# Patient Record
Sex: Female | Born: 1935 | Race: White | Hispanic: No | Marital: Married | Smoking: Never smoker
Health system: Southern US, Community
[De-identification: ages and names within clinical notes are randomized; demographics above are authoritative.]

## PROBLEM LIST (undated history)

## (undated) DIAGNOSIS — R600 Localized edema: Secondary | ICD-10-CM

## (undated) DIAGNOSIS — I4891 Unspecified atrial fibrillation: Secondary | ICD-10-CM

## (undated) DIAGNOSIS — C801 Malignant (primary) neoplasm, unspecified: Secondary | ICD-10-CM

## (undated) DIAGNOSIS — E039 Hypothyroidism, unspecified: Secondary | ICD-10-CM

## (undated) DIAGNOSIS — R609 Edema, unspecified: Secondary | ICD-10-CM

## (undated) DIAGNOSIS — I10 Essential (primary) hypertension: Secondary | ICD-10-CM

## (undated) DIAGNOSIS — F0390 Unspecified dementia without behavioral disturbance: Secondary | ICD-10-CM

## (undated) DIAGNOSIS — M797 Fibromyalgia: Secondary | ICD-10-CM

## (undated) DIAGNOSIS — F419 Anxiety disorder, unspecified: Secondary | ICD-10-CM

## (undated) HISTORY — PX: CARPAL TUNNEL RELEASE: SHX101

## (undated) HISTORY — PX: JOINT REPLACEMENT: SHX530

## (undated) HISTORY — PX: APPENDECTOMY: SHX54

## (undated) HISTORY — PX: BLADDER SUSPENSION: SHX72

## (undated) HISTORY — PX: CHOLECYSTECTOMY: SHX55

## (undated) HISTORY — PX: KNEE ARTHROSCOPY: SUR90

## (undated) HISTORY — PX: ABDOMINAL HYSTERECTOMY: SHX81

---

## 1997-05-05 ENCOUNTER — Encounter: Admission: RE | Admit: 1997-05-05 | Discharge: 1997-08-03 | Payer: Self-pay | Admitting: *Deleted

## 1998-11-28 ENCOUNTER — Ambulatory Visit (HOSPITAL_COMMUNITY): Admission: RE | Admit: 1998-11-28 | Discharge: 1998-11-28 | Payer: Self-pay

## 2000-09-23 ENCOUNTER — Encounter: Admission: RE | Admit: 2000-09-23 | Discharge: 2000-09-23 | Payer: Self-pay

## 2002-01-20 ENCOUNTER — Other Ambulatory Visit: Admission: RE | Admit: 2002-01-20 | Discharge: 2002-01-20 | Payer: Self-pay | Admitting: Obstetrics and Gynecology

## 2002-03-20 ENCOUNTER — Emergency Department (HOSPITAL_COMMUNITY): Admission: EM | Admit: 2002-03-20 | Discharge: 2002-03-21 | Payer: Self-pay | Admitting: Emergency Medicine

## 2002-03-29 ENCOUNTER — Encounter: Payer: Self-pay | Admitting: Urology

## 2002-04-01 ENCOUNTER — Encounter (INDEPENDENT_AMBULATORY_CARE_PROVIDER_SITE_OTHER): Payer: Self-pay | Admitting: Specialist

## 2002-04-01 ENCOUNTER — Inpatient Hospital Stay (HOSPITAL_COMMUNITY): Admission: RE | Admit: 2002-04-01 | Discharge: 2002-04-02 | Payer: Self-pay | Admitting: Urology

## 2004-02-07 ENCOUNTER — Encounter: Admission: RE | Admit: 2004-02-07 | Discharge: 2004-05-07 | Payer: Self-pay

## 2004-05-31 ENCOUNTER — Encounter: Admission: RE | Admit: 2004-05-31 | Discharge: 2004-05-31 | Payer: Self-pay

## 2005-02-11 ENCOUNTER — Other Ambulatory Visit: Admission: RE | Admit: 2005-02-11 | Discharge: 2005-02-11 | Payer: Self-pay | Admitting: Obstetrics and Gynecology

## 2005-10-28 ENCOUNTER — Encounter: Admission: RE | Admit: 2005-10-28 | Discharge: 2005-10-28 | Payer: Self-pay | Admitting: Cardiology

## 2006-07-25 ENCOUNTER — Ambulatory Visit (HOSPITAL_COMMUNITY): Admission: RE | Admit: 2006-07-25 | Discharge: 2006-07-25 | Payer: Self-pay | Admitting: Cardiology

## 2006-07-28 ENCOUNTER — Emergency Department (HOSPITAL_COMMUNITY): Admission: EM | Admit: 2006-07-28 | Discharge: 2006-07-28 | Payer: Self-pay | Admitting: Emergency Medicine

## 2006-09-29 ENCOUNTER — Encounter: Admission: RE | Admit: 2006-09-29 | Discharge: 2006-09-29 | Payer: Self-pay | Admitting: Cardiology

## 2006-12-10 ENCOUNTER — Inpatient Hospital Stay (HOSPITAL_COMMUNITY): Admission: AD | Admit: 2006-12-10 | Discharge: 2006-12-11 | Payer: Self-pay | Admitting: Orthopedic Surgery

## 2007-04-08 ENCOUNTER — Inpatient Hospital Stay (HOSPITAL_COMMUNITY): Admission: RE | Admit: 2007-04-08 | Discharge: 2007-04-15 | Payer: Self-pay | Admitting: Orthopaedic Surgery

## 2007-09-01 ENCOUNTER — Encounter: Admission: RE | Admit: 2007-09-01 | Discharge: 2007-09-01 | Payer: Self-pay | Admitting: Cardiology

## 2008-03-01 ENCOUNTER — Encounter: Admission: RE | Admit: 2008-03-01 | Discharge: 2008-03-01 | Payer: Self-pay | Admitting: Cardiology

## 2008-08-23 ENCOUNTER — Encounter: Admission: RE | Admit: 2008-08-23 | Discharge: 2008-08-23 | Payer: Self-pay | Admitting: Cardiology

## 2009-03-11 IMAGING — CR DG CHEST 2V
2 series · 2 of 2 positions shown · non-contrast
Comparison: 10/28/05.

CLINICAL DATA: Pre-cardiac catheterization.
 CHEST - 2 VIEW - 07/25/06:

[view not recorded (1 of 2)]
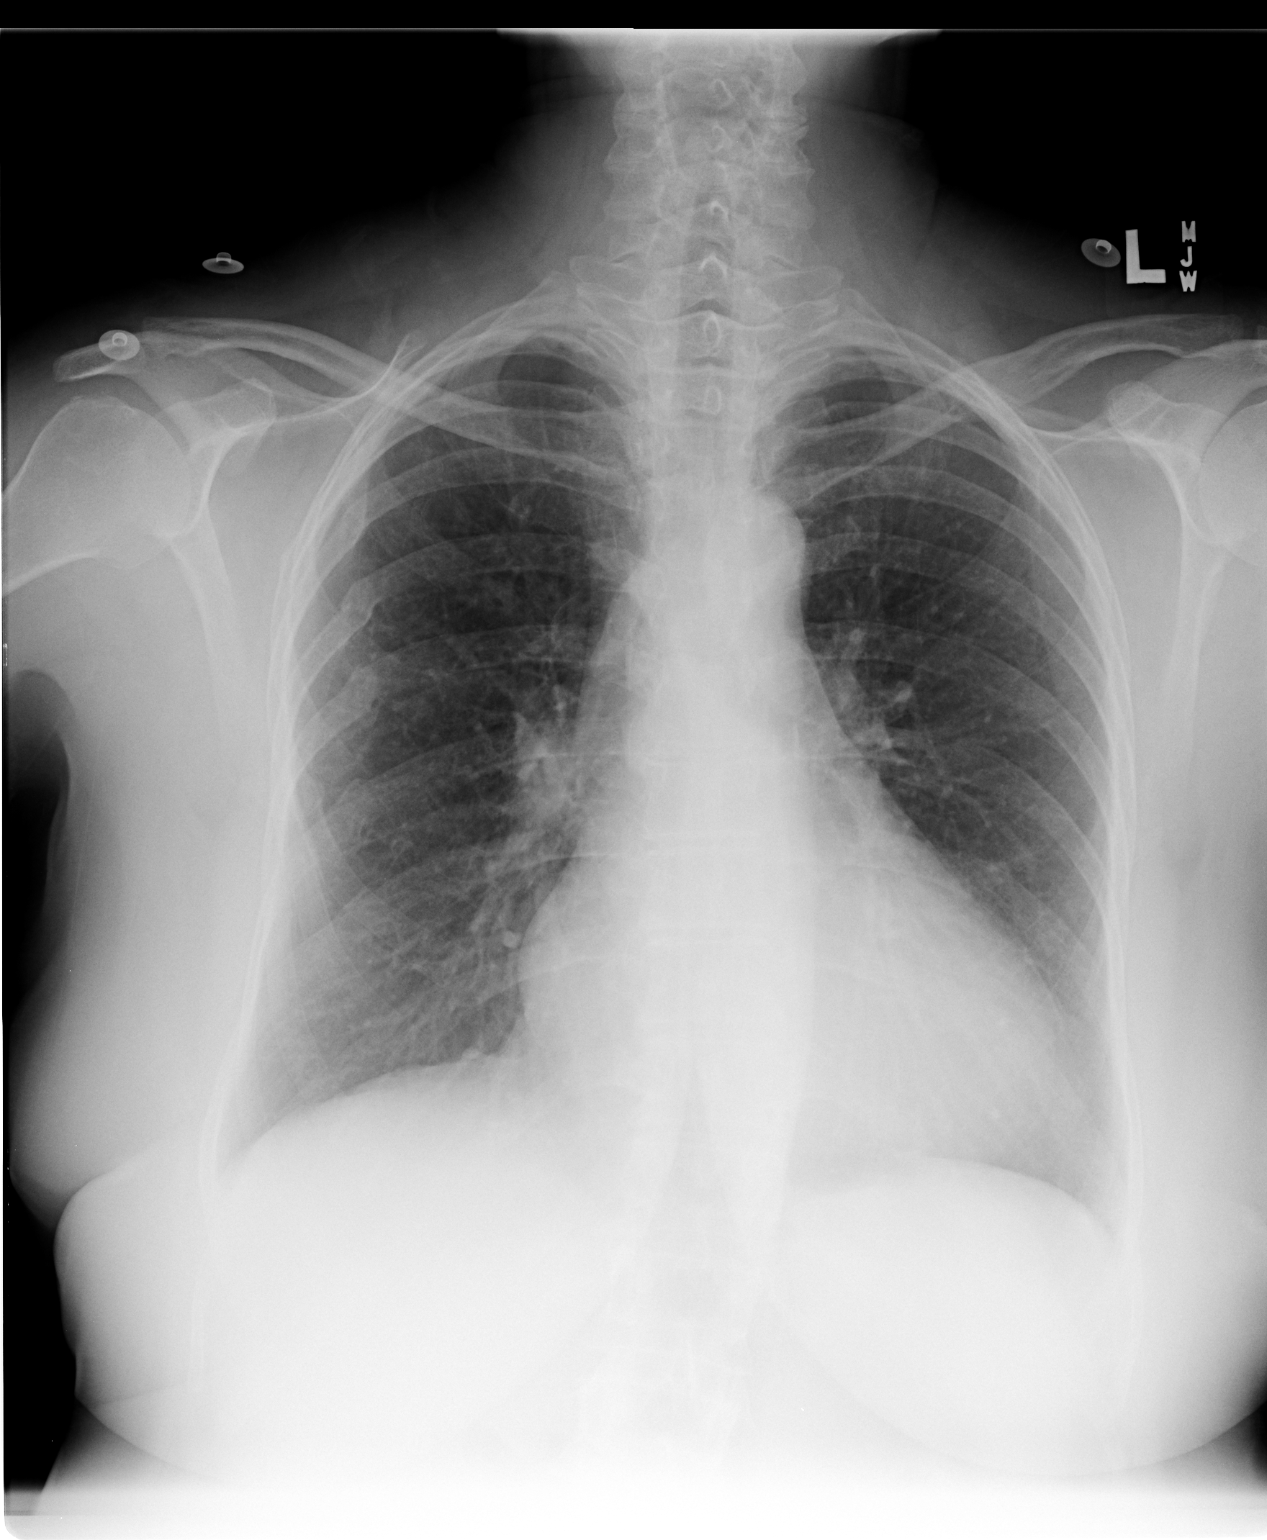

[view not recorded (2 of 2)]
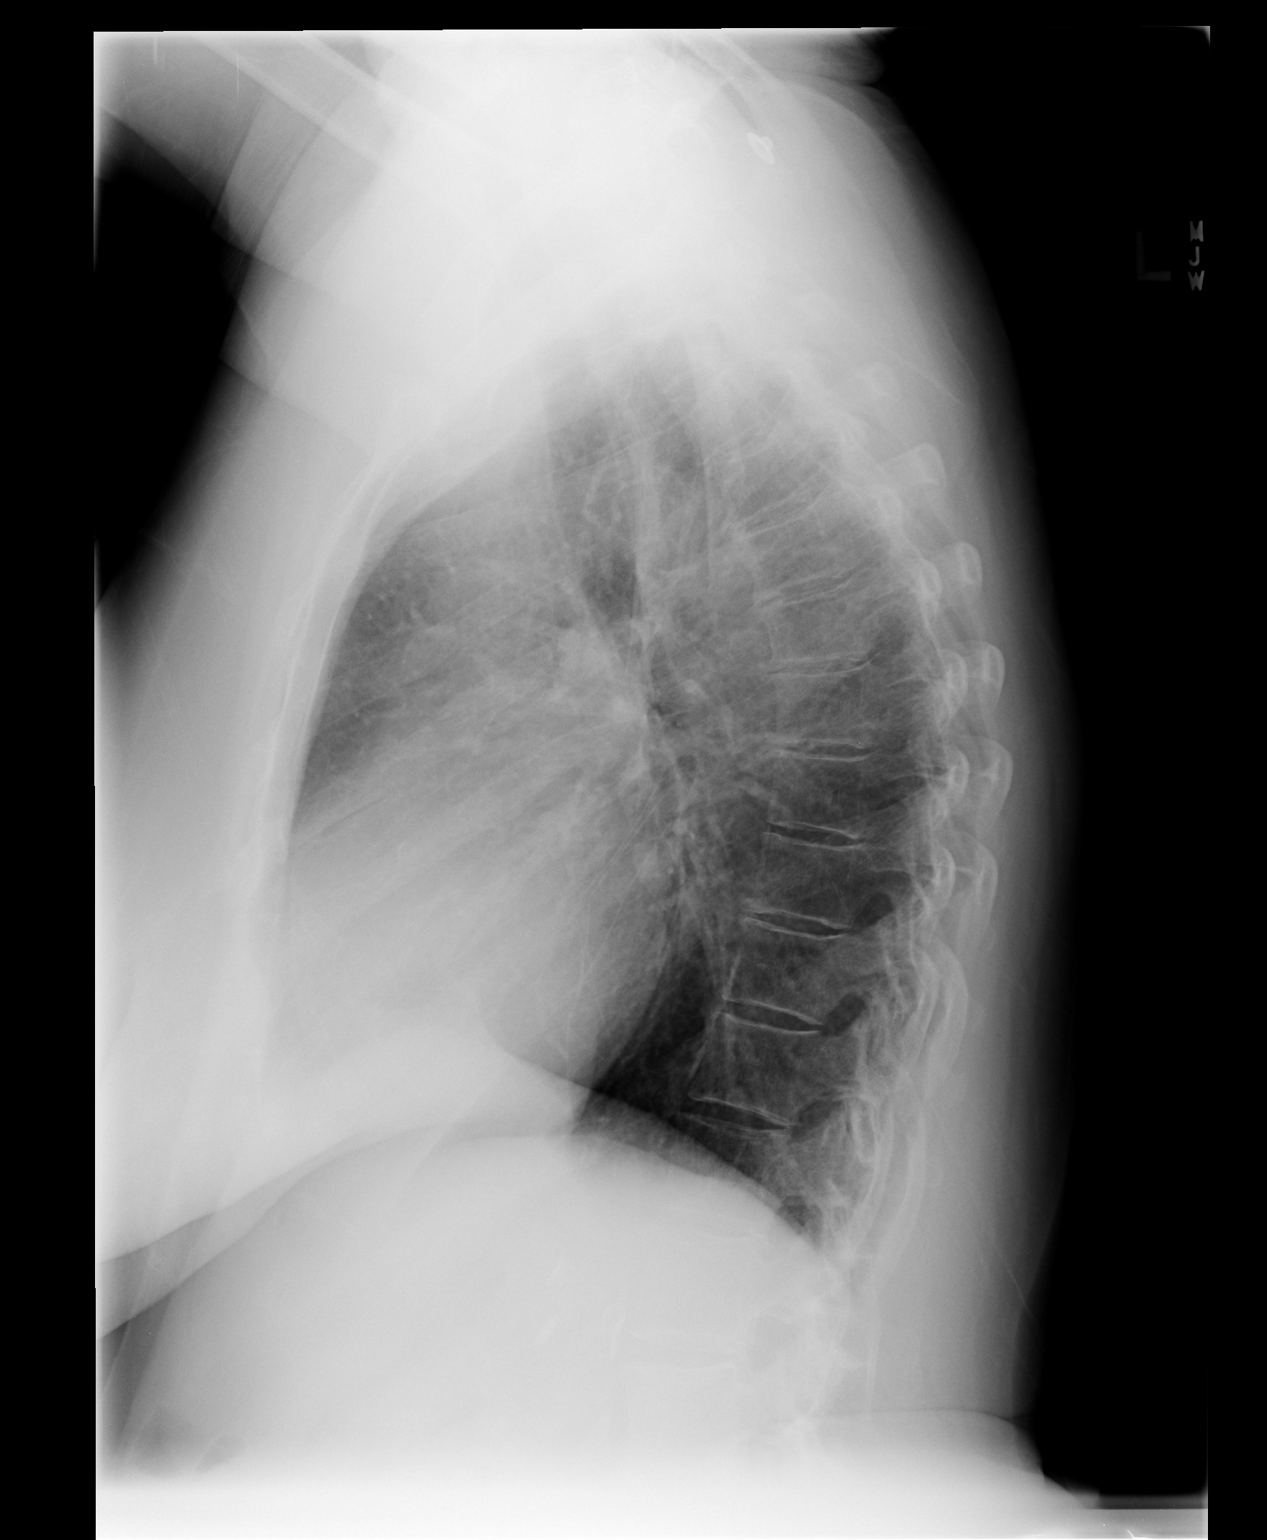

[2 of 2 positions shown; findings below may reference images not displayed]

FINDINGS: The trachea is midline.   Cardiac enlargement is stable.  The lungs are clear.   Old rib fractures.
IMPRESSION: Cardiac enlargement without acute finding.

## 2009-03-14 IMAGING — CR DG CHEST 1V PORT
1 series · 1 of 1 positions shown · non-contrast
Comparison: 07/25/06.

CLINICAL DATA: Chest pain and shortness of breath.
 PORTABLE CHEST - 1 VIEW - 07/28/06:

[view not recorded]
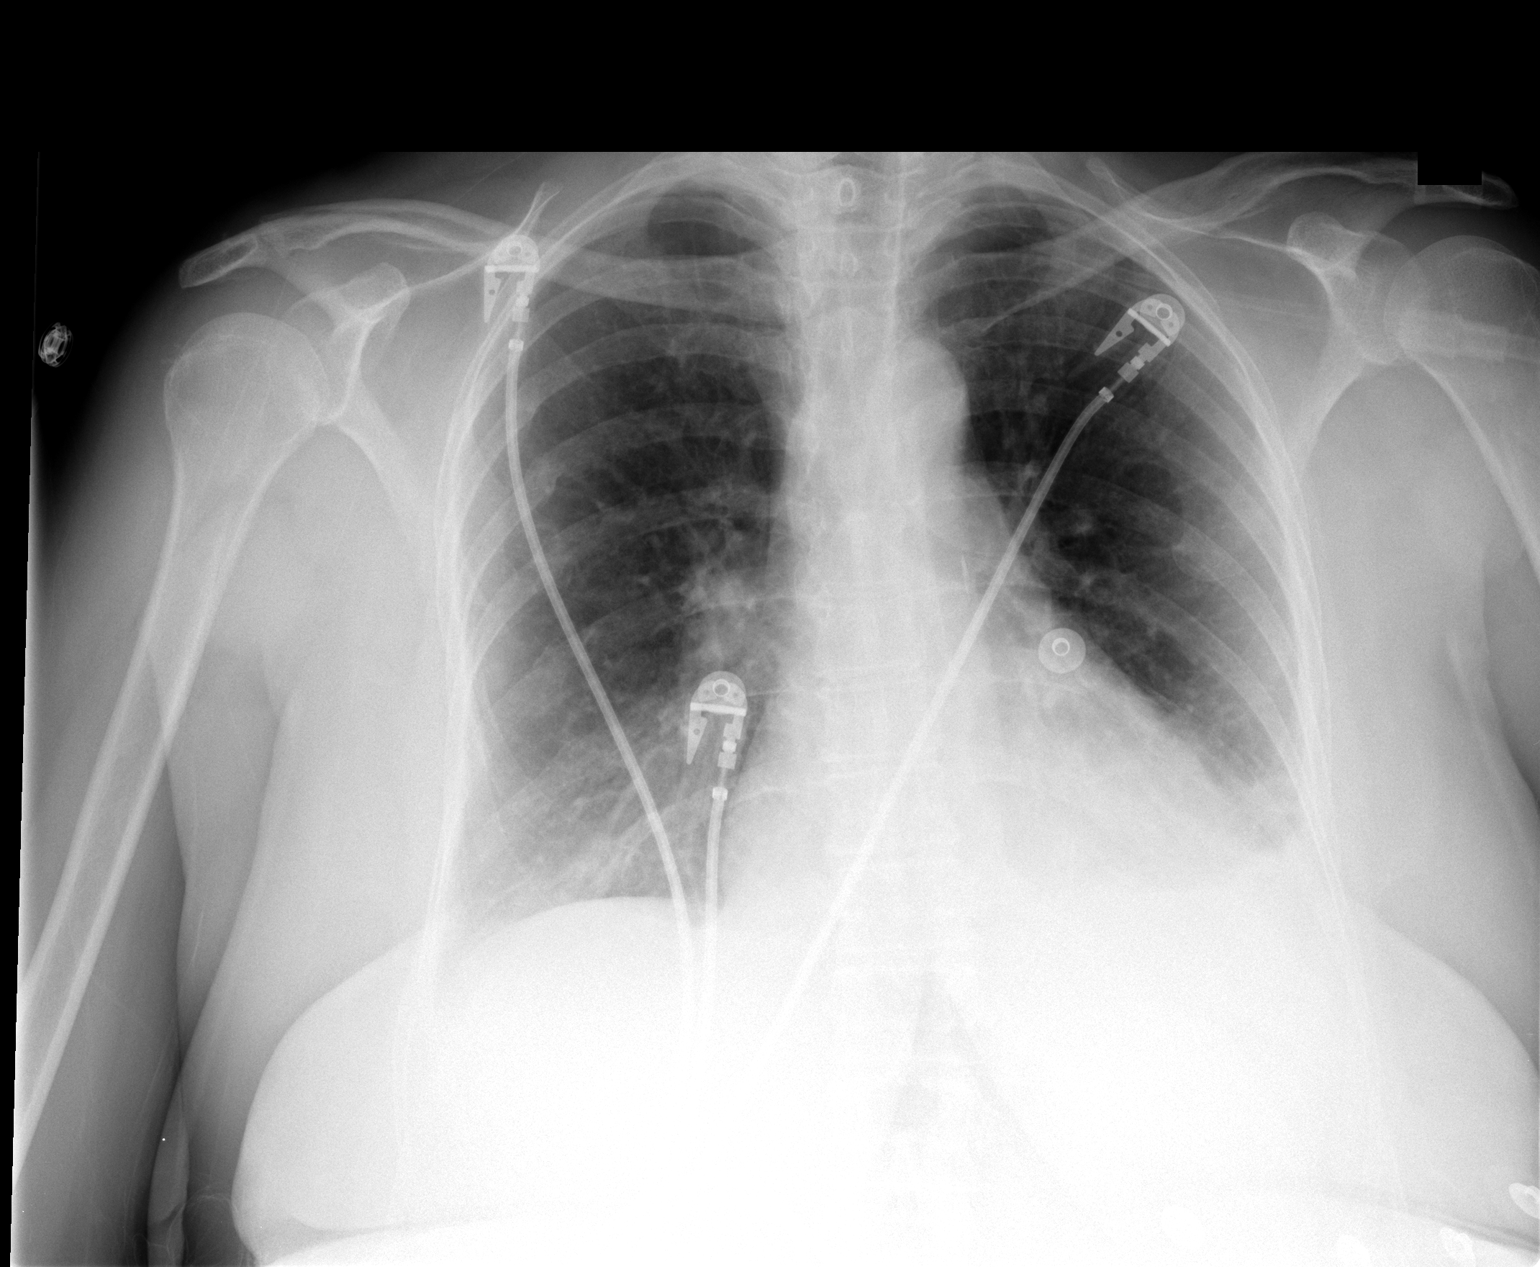

[1 of 1 positions shown; findings below may reference images not displayed]

FINDINGS: Left effusion with left lower lobe atelectasis.  Question underlying infiltrate.  Negative for edema.  The heart is normal.
IMPRESSION: Left effusion with left lower lobe atelectasis.

## 2010-02-26 ENCOUNTER — Encounter
Admission: RE | Admit: 2010-02-26 | Discharge: 2010-02-26 | Payer: Self-pay | Source: Home / Self Care | Attending: Cardiology | Admitting: Cardiology

## 2010-06-19 NOTE — Op Note (Signed)
NAMEMARCELLENE, SHIVLEY NO.:  [id number]   MEDICAL RECORD NO.:  [id number]          PATIENT TYPE:  OIB   LOCATION:  5035                         FACILITY:  [facility]   PHYSICIAN:  Burnard Bunting, M.D.    DATE OF BIRTH:  1926-07-10   DATE OF PROCEDURE:  12/09/2006  DATE OF DISCHARGE:                               OPERATIVE REPORT   PREOPERATIVE DIAGNOSIS:  Left knee medial meniscal tear.   POSTOPERATIVE DIAGNOSIS:  Left knee medial meniscal tear.   PROCEDURE:  Left knee diagnostic and operative arthroscopy with partial  posterior horn medial meniscectomy and microfracture of medial femoral  condyle defect.   SURGEON:  Burnard Bunting, M.D.   ASSISTANT:  None.   ANESTHESIA:  MAC plus local.   INDICATIONS:  Chandani Rogowski is a 77 year old female who felt a pop in her  knee and has had painful swelling since that time.  She presents now for  operative management after failure of conservative management.   OPERATIVE FINDINGS:  1. Examination under anesthesia:  Range of motion 0-130 with stability      to varus-valgus stress, ACL and PCL intact, no posterior rotatory      instability is noted.  2. 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   PROCEDURE IN DETAIL:  The patient was brought to the operating room,  where MAC plus local anesthetic was induced.  The left leg and foot were  prepped with DuraPrep solution and draped in a sterile manner.   Topographic anatomy of the knee was then found including the medial and  lateral margins of the  patellar tendon, the medial and lateral joint  line.  Anterior and inferolateral portals were established.  Anteroinferior and medial portals were then established under direct  visualization.  Diagnostic arthroscopy was performed.  Chondromalacia  was noted on the undersurface of the patella, but there were no chondral  flaps.  The suprapatellar pouch had synovitis, which was not debrided.  No loose bodies in the medial or lateral gutter.  The medial compartment  intact.  The lateral compartment was intact.  ACL and PCL was intact.  The medial compartment demonstrated a small chondral defect measuring  just under 1 x 1 cm, which was treated with debridement and  microfracture.  There was also a complete radial tear of the posterior  horn of the medial meniscus, the unstable flap which was also debrided  back to  a stable rim.  Following debridement, the knee joint was thoroughly  irrigated and instruments removed.  Portals were closed using 3-0 nylon  sutures.  A solution of Marcaine, morphine and clonidine were injected  into the knee.  The patient tolerated the procedure well without  immediate complications.  A bulky wrap was applied.      Burnard Bunting, M.D.  Electronically Signed     GSD/MEDQ  D:  12/09/2006  T:  12/10/2006  Job:  [id number]

## 2010-06-19 NOTE — Op Note (Signed)
NAMECOHEN, DOLEMAN NO.:  [id number]   MEDICAL RECORD NO.:  [id number]          PATIENT TYPE:  INP   LOCATION:  5034                         FACILITY:  [facility]   PHYSICIAN:  Mark C. Ophelia Charter, M.D.    DATE OF BIRTH:  [birth year]   DATE OF PROCEDURE:  04/08/2007  DATE OF DISCHARGE:                               OPERATIVE REPORT   PREOPERATIVE DIAGNOSIS:  Left knee osteoarthritis.   POSTOPERATIVE DIAGNOSIS:  Left knee osteoarthritis.   PROCEDURE:  Left cemented total knee arthroplasty computer assist.   SURGEON:  Mark C. Ophelia Charter, M.D.   ANESTHESIA:  GOT plus Marcaine local plus preoperative femoral nerve  block.   TOURNIQUET TIME:  1 hour and 12 minutes   ASSISTANT:  Maud Deed, P.A.-C.   COMPLICATIONS:  None.   ESTIMATED BLOOD LOSS:  Less than 100 mL.   COMPONENTS USED:  DePuy J&J PFC number 2 femur with lugs, rotating  platform 12.5 mm thickness poly, number 2 tibial tray and 35 mm all poly  patella.   PROCEDURE IN DETAIL:  After general anesthesia with orotracheal  intubation, preoperative Ancef prophylaxis, standard prep and drape with  DuraPrep with a heel bump, lateral posts, that would contact the  tourniquet with the knee at 90 degrees flexion.  Impervious stockinette  and Coban.  Usual total knee split sheets, drapes, sterile skin marker,  Betadine, and Vidrape was used to seal the skin.  The time out procedure  was then taken with confirmation of antibiotics, positioning, patient  identification, planned procedure, permit.  The leg was wrapped in an  Esmarch and tourniquet inflated.  A midline incision was made. Medial  parapatellar tendon incision was made splitting the quad tendon between  the medial 1/3 and lateral 2/3.  The patella was everted and menisci  were resected.  There was bone-on-bone changes in the medial  compartment, grade 3 and 4 for chondromalacia of the patellofemoral  joint.  Sizing was appropriate for a number 2 femur and  initially 10 mm  of bone was removed off of the distal femur.  A notch cut was not made,  but chamfer cuts were made first then followed by cuts on the tibia.  Two pins were then placed in the femur for computer guidance and also  two in the tibia through a stab incision with a 15 blade and initiation  with generation computer model for femur and tibia.  There was 8 degrees  of varus and 8 degrees of flexion contracture.  The computer showed  satisfactory flexion/extension gaps within 0.5 mm on flexion/extension.  There was 0.3 degrees valgus of the femoral component.  The tibial slope  was right on on varus, valgus and AP slope.  A keel cut was made in the  tibia.  The ACL and PCL remnants were resected.  The menisci were  resected.  Pulse lavage was used.  The patella was resected with an  oscillating saw from facet to facet and then sized for 35 mm holes  drilled.  Lug holes were drilled in the femur.  Trials were inserted.  There was symmetrical flexion/extension.  The computer showed excellent  position.  All trials were removed.  Pulsatile lavage.  Cement was  mixed.  The tibia was cemented first, all excess cement was removed,  then the femur was cemented followed by the patella, and after the  cement was hard at 15 minutes, the tourniquet was deflated.  Hemostasis  was obtained and then layered closure with #1 Tycron in the deep fascia,  2-0 Vicryl in the subcutaneous tissue, and subcuticular skin closure.  Marcaine infiltration, postop dressing, knee immobilizer.  Instrument  count and needle count was correct.  The patient tolerated the procedure  well and was transferred to recovery in stable condition.      Mark C. Ophelia Charter, M.D.  Electronically Signed     MCY/MEDQ  D:  04/08/2007  T:  04/08/2007  Job:  [id number]

## 2010-06-19 NOTE — Discharge Summary (Signed)
NAMEMAGAN, WINNETT NO.:  [id number]   MEDICAL RECORD NO.:  [id number]          PATIENT TYPE:  INP   LOCATION:  5034                         FACILITY:  [facility]   PHYSICIAN:  Mark C. Ophelia Charter, M.D.    DATE OF BIRTH:  02-29-1926   DATE OF ADMISSION:  04/08/2007  DATE OF DISCHARGE:  04/15/2007                               DISCHARGE SUMMARY   FINAL DIAGNOSES:  1. Left total knee arthroplasty.  2. Acute blood loss anemia.  3. Tibial plateau nondisplaced fracture.   HISTORY:  This is a 76 year old female who has had longstanding left  knee osteoarthritis who had previous arthroscopy with significant  degenerative changes, bone on bone changes.  She has been through anti-  inflammatories, intra-articular injections, arthroscopy and had drilling  of an osteochondral lesion with progressive pain.   The patient was admitted at this time after informed consent for total  knee arthroplasty.  Admission labs included hemoglobin of 13, normal  white count.  Electrolytes were normal.  BUN was 4.4.  PT and PTT were  normal.  Preoperative chest x-ray showed no acute findings, old right  rib fracture.  An EKG showed rightward axis, septal infarct age  indeterminate, no significant change from previous tracing, sinus  bradycardia.  The patient had a 1 day adenosine Cardiolite preoperative  study done in 2007 which was normal with an ejection fraction of 73% and  she is followed by Dr. Lacretia Nicks. Viann Fish.   The patient was taken to the operating room and underwent total knee  arthroplasty cemented.  Postoperative x-rays were initially read as  normal.  There was some suspicion of some changes in the medial tibial  plateau.  She is left touchdown weightbearing until a few days after  surgery where better films in radiology department were obtained.  This  indeed did show a nondisplaced fracture.  Tibial component was in good  position, good size.  Hemoglobin postop decreased to 9.1.   She did not  require a transfusion.  Creatinine was 1.21.  She was started on DVT  prophylaxis with Coumadin.  She received foot pumpers.  PCA initially  then conversion to OxyContin and Percocet.  OxyContin was cut from 20  q.12 h. to 10 q.12 h.  She is taking the Tylox 1 or 2 p.o. q.4-6 h.  p.r.n.  The patient had a bowel movement.  She was ambulatory but made  slow progress due to touchdown weightbearing which will be in effect  until she is 4 weeks after her surgery at which time she should be able  to weight bear as tolerated.  Due to slow progress with therapy with her  touchdown weightbearing limitations plan is for nursing home transfer  for assistance.  I plan to check her in the office in 2 weeks.  She had  a subcuticular skin closure and she was getting Coumadin 5 mg daily and  INRs were stable at 1.5.  She will continue on Coumadin until she is 1  month postop.   CONDITION AT TRANSFER:  Satisfactory.  Mark C. Ophelia Charter, M.D.  Electronically Signed    MCY/MEDQ  D:  04/15/2007  T:  04/15/2007  Job:  [id number]

## 2010-06-19 NOTE — Op Note (Signed)
[name], FOWLE NO.:  [id number]   MEDICAL RECORD NO.:  [id number]          PATIENT TYPE:  OIB   LOCATION:  2854                         FACILITY:  [facility]   PHYSICIAN:  Georga Hacking, M.D.DATE OF BIRTH:  01-Apr-1926   DATE OF PROCEDURE:  07/25/2006  DATE OF DISCHARGE:  07/25/2006                               OPERATIVE REPORT   PROCEDURE PERFORMED:  Cardioversion.   77 year old female with persistent atrial fibrillation, has been on  Coumadin as well as amiodarone. The patient was brought to [facility] and had anesthesia administered with 200 mg of Pentothal by the  anesthesiologist. Cardioversion was done with synchronized biphasic  defibrillation, initially started at 75 watts and then escalated to 120  and then 200 watts with resultant cardioversion to sinus rhythm after  the third shock.  She tolerated the procedure well.   IMPRESSION:  Successful cardioversion of atrial fibrillation.   PLAN:  She is to go home and follow up next week in the office.  Continue medicines.      Georga Hacking, M.D.  Electronically Signed     WST/MEDQ  D:  07/25/2006  T:  07/25/2006  Job:  [id number]

## 2010-06-19 NOTE — Consult Note (Signed)
NAMEARMIE, [name] NO.:  [id number]   MEDICAL RECORD NO.:  [id number]          PATIENT TYPE:  EMS   LOCATION:  MAJO                         FACILITY:  [facility]   PHYSICIAN:  Georga Hacking, M.D.DATE OF BIRTH:  [birth year]   DATE OF CONSULTATION:  07/28/2006  DATE OF DISCHARGE:                                 CONSULTATION   EMERGENCY ROOM NOTE   The patient is a 77 year old female who has a history of dyspnea as well  as atrial fibrillation and hypertension. She underwent elective  cardioversion on Friday with conversion to sinus rhythm. She has noticed  worsening dyspnea over the weekend as well as some PND. She was  concerned about this and called the office today and felt her dyspnea  was severe enough that she needed to come to the emergency room. She has  had a fairly thorough workup including a negative Cardiolite test and an  echocardiogram that shows concentric LVH with normal systolic function.  The husband notes that the air-conditioning system has had to be worked  on in the past week and wonders if there can be any link to that. She  has had some mild elevation of white count. She denies chest pain  suggestive of angina. Her current medications are Cymbalta, lisinopril  hydrochlorothiazide, warfarin, metoprolol, nifedipine and aspirin.   Her past history is recorded in the previously dictated note of Friday.  Social history and review of systems are otherwise unchanged since  Friday.   On examination, she is a pleasant, obese woman who is currently in no  acute distress. Her blood pressure is 136/59, and her pulse was 50.  Her skin was warm and dry.  HEENT:  EOMI. PERRLA. C&S clear. Oropharynx negative.  NECK:  Supple without masses. No JVD.  LUNGS:  Decreased breath sounds in the left base.  CARDIAC EXAM:  Normal S1 and S2. No S3.  ABDOMEN:  Soft, nontender.  One to 2 plus edema noted.   Her CBC and chemistry panel were normal. INR was 3.8.  EKG shows sinus  bradycardia with slow heart rate at times with a wandering pacemaker  with heart rates as low as 40 to 46. There is nonspecific ST abnormality  noted. Chest x-ray shows what appears to be a left effusion and some  atelectasis which is new from a previous chest x-ray taken on Friday.   IMPRESSION:  1. Worsening dyspnea that may be due to bradycardia, may be due to      intercurrent pneumonia which is developed in the left base.  2. Atrial fibrillation, currently in sinus rhythm following      cardioversion.  3. Chronic anticoagulation.  4. Possible pneumonia versus atelectasis, left base.   RECOMMENDATIONS:  Her D-dimer was negative. She has been on chronic  Coumadin. My recommendations would be to taper off of the metoprolol at  25 mg twice a day for 4 more days and then discontinue this to see if a  higher heart rate will help her dyspnea. She also will be treated for  the possible pneumonia and effusion  with a Z-Pack. She should continue  furosemide but will increase the dose to twice a day. Follow up either  Thursday or Friday of this week.      Georga Hacking, M.D.  Electronically Signed     WST/MEDQ  D:  07/28/2006  T:  07/28/2006  Job:  [id number]   cc:   Carlis Stable, M.D.

## 2010-06-22 NOTE — H&P (Signed)
NAMEKENZI, [name]                          ACCOUNT NO.:  [id number]   MEDICAL RECORD NO.:  [id number]                   PATIENT TYPE:  INP   LOCATION:  NA                                   FACILITY:  [facility]   PHYSICIAN:  Duke Salvia. Marcelle Collins, M.D.            DATE OF BIRTH:  Jun 13, 1926   DATE OF ADMISSION:  04/01/2002  DATE OF DISCHARGE:                                HISTORY & PHYSICAL   CHIEF COMPLAINT:  Pelvic pressure, cystocele, SUI.   HISTORY OF PRESENT ILLNESS:  A 77 year old G6, P4 postmenopausal patient,  prior TAH/BSO.  Has been on Premarin 0.625 daily.  She was referred to me  last year by [name], M.D. with a three month history of urinary  incontinence.  My examination showed at that time that she had good cuff  support and vulvar examination was normal with a cystocele, small rectocele  noted.  Work-up and evaluation by Excell Seltzer. [name], M.D. showed that she had  SUI and she presents now for A&P repair with pubovaginal sling per Excell Seltzer.  [name], M.D.  This procedure including risk of bleeding, infection, adjacent  organ injury, her expected recovery time all discussed with her which she  understands and accepts.   ALLERGIES:  None.   PAST SURGICAL HISTORY:  1. Abdominal hysterectomy in 1970.  2. BSO in 1997.  3. Cholecystectomy in 1977.   OBSTETRICAL:  She has had four vaginal deliveries at term, two SABs.   CURRENT MEDICATIONS:  1. Celebrex 200 mg daily.  2. Multivitamins.  3. Synthroid daily.  4. Premarin 0.625 daily.  5. Flexeril p.r.n.  6. Ativan p.r.n.  7. Lasix p.r.n.   REVIEW OF SYSTEMS:  Significant for prior history of fibroids and  menorrhagia which prompted her hysterectomy.  Significant for  hypothyroidism, migraine headache.  She denies smoking or alcohol use.   FAMILY HISTORY:  Significant for a son with asthma.  Her mother and father  both had IDDM.  Deceased sister with breast cancer.   PHYSICAL EXAMINATION:  VITAL SIGNS:  Temperature  98.2, blood pressure  150/70.  HEENT:  Unremarkable.  NECK:  Supple without mass.  LUNGS:  Clear.  CARDIOVASCULAR:  Regular rate and rhythm without murmurs, rubs, gallops  noted.  BREASTS:  Without masses.  ABDOMEN:  Soft, flat, nontender.  PELVIC:  Normal external genitalia.  Vaginal cuff clear.  Cuff support was  good.  On straining there was a cystocele and rectocele.  Bimanual was  otherwise negative.  EXTREMITIES:  Unremarkable.  NEUROLOGIC:  Unremarkable.   IMPRESSION:  1. Symptomatic cystocele and rectocele.  2. Stress urinary incontinence.   PLAN:  A&P repair.  Pubovaginal sling per Excell Seltzer. [name], M.D.  Procedure and  risks reviewed as above.  [name], M.D.    RMH/MEDQ  D:  03/26/2002  T:  03/26/2002  Job:  [id number]

## 2010-06-22 NOTE — Op Note (Signed)
[name], LEGERE                          ACCOUNT NO.:  [id number]   MEDICAL RECORD NO.:  [id number]                   PATIENT TYPE:  INP   LOCATION:  0002                                 FACILITY:  [facility]   PHYSICIAN:  Excell Seltzer. [name], M.D.                 DATE OF BIRTH:  May 28, 1926   DATE OF PROCEDURE:  04/01/2002  DATE OF DISCHARGE:                                 OPERATIVE REPORT   PROCEDURE:  SPARC sling.   PREOPERATIVE DIAGNOSES:  Stress incontinence.   POSTOPERATIVE DIAGNOSES:  Stress incontinence.   SURGEON:  Excell Seltzer. [name], M.D.   ASSISTANT:  Duke Salvia. Marcelle Overlie, M.D.   ANESTHESIA:  General.   DRAINS:  Foley catheter vaginal pack.   COMPLICATIONS:  None.   INDICATIONS FOR PROCEDURE:  Ms. Rada is a 77 year old white female with  stress incontinence who is to undergo an A&P repair by Dr. Marcelle Overlie. I will  perform a SPARC sling during the procedure.   DESCRIPTION OF PROCEDURE:  The patient was taken to the operating room after  receiving preoperative antibiotics. A general anesthetic was induced, she  was placed in lithotomy position. Her perineum and genitalia were prepped  with Betadine solution and she was draped in the usual sterile fashion. Dr.  Marcelle Overlie performed an anterior and posterior repair and left the anterior  vaginal wall mucosa open. I then entered the procedure where I extended the  vaginal mucosal incision toward the mid urethral level and elevated the  mucosa off the pubourethral fascia laterally for approximately 2 cm on each  side. Two small incisions were made 2 cm lateral through the midline over  the pubis one on each side and the fat was spread to the fascia with a  hemostat. The [facility] trocars were then passed initially on the right side  through the abdominal incision down into the vaginal incision. The trocar  was walked behind the pubic bone and then guided by a finger in the vaginal  incision which was also being used to push the urethra  out of the way. This  was then repeated on the left. Once both trocars had been passed, cystoscopy  was performed using the 22 degree scope and 70 degree lens. Examination  revealed no evidence of bladder injury. The bladder was drained and the  [facility] mesh was then snapped to the ends of the trocars and drawn up to the  abdominal incision. Recystoscopy was performed once again and no evidence of  injury of the bladder or urethra was noted. The bladder was left full at  this time, the scope was removed. A right angled clamp was placed beneath  the mesh in the vaginal incision and the sheath was removed from each side  to position the mesh. Once in position, pressure on the bladder produced a  reasonable stream. The Foley catheter was then reinserted, balloon was  filled  with 10 mL of sterile fluid. The bladder was drained, the position of  the mesh was once again checked. I was able to pass the right angled clamp  easily between the mesh and the urethra without any snugness at all. At this  point, the  abdominal ends of the mesh were trimmed so they fell back into the  subcutaneous space. Dr. Marcelle Overlie then closed the anterior vaginal wall to  complete the anterior repair. At the end of the procedure, the abdominal  incisions were closed with Steri-Strips. There were no complications during  my portion of the procedure.                                               Excell Seltzer. [name], M.D.    JJW/MEDQ  D:  04/01/2002  T:  04/01/2002  Job:  [id number]   cc:   Duke Salvia. Marcelle Overlie, M.D.  [address], [address]  [place] [address]  Fax: [phone]

## 2010-06-22 NOTE — Op Note (Signed)
[name], [name]                          ACCOUNT NO.:  [id number]   MEDICAL RECORD NO.:  [id number]                   PATIENT TYPE:  INP   LOCATION:  0002                                 FACILITY:  [facility]   PHYSICIAN:  Duke Salvia. Marcelle Overlie, M.D.            DATE OF BIRTH:  06/07/1926   DATE OF PROCEDURE:  04/01/2002  DATE OF DISCHARGE:                                 OPERATIVE REPORT   PREOPERATIVE DIAGNOSIS:  Cystocele and rectocele, symptomatic.   POSTOPERATIVE DIAGNOSIS:  Cystocele and rectocele, symptomatic.   PROCEDURE:  Anterior and posterior colporrhaphy.   SURGEON:  Duke Salvia. Marcelle Overlie, M.D.   ASSISTANT:  Dineen Kid. Rana Snare, M.D.   ANESTHESIA:  Spinal.   COMPLICATIONS:  None.   DRAINS:  In and out Foley catheter.   BLOOD LOSS:  150.   PROCEDURE AND FINDINGS:  The patient went to the operating room.  After an  adequate level of spinal anesthetic was obtained with the patient's legs in  stirrups, perineum and vagina along with the lower abdomen were prepped and  draped in the usual manner for sterile abdominal procedures.  In and out  catheter used to drain the bladder.  The decision was made to proceed with  the posterior parts so that Excell Seltzer. [name], M.D. could perform the  pubovaginal sling at the end.  The small triangle of perineal skin was  excised.  The posterior vaginal mucosa was divided in the midline  approximate to the posterior cuff which was well-supported.  The underlying  perirectal fascia was then divided with sharp and blunt dissection, freed  from the mucosa.  Plication sutures were then used to plicate the perirectal  fascia in the midline with 2-0 Dexon interrupted sutures.  A small amount of  excess mucosa was trimmed, and then this was reapproximated with interrupted  2-0 Dexon sutures.  Then 3-0 Vicryl Rapide sutures used on the deep and  superficial perineal skin.  At that point, the anterior repair was started;  Allis clamps were used to grasp  the anterior mucosa close to the cuff.  This  was incised and divided up to the UV angle.  The underlying perivesical  fascia was then divided with sharp and blunt dissection, plicated it the  midline with 2-0 Dexon interrupted sutures.  A small amount of vaginal  mucosa was divided, was removed, and then reapproximated in the lower  portion.  The upper portion near the UV angle was left open for Excell Seltzer.  [name], M.D. to perform the pubovaginal sling which was dictated separately.  After the sling procedure was completed, the remainder of the mucosa was  closed with 2-0 Dexon interrupted sutures with excellent result.  Pack was  placed.  She tolerated this well and went to recovery room.  [name], M.D.   RMH/MEDQ  D:  04/01/2002  T:  04/01/2002  Job:  [id number]

## 2010-06-22 NOTE — H&P (Signed)
NAMEMIRANDA, FRESE                          ACCOUNT NO.:  [id number]   MEDICAL RECORD NO.:  [id number]                   PATIENT TYPE:  INP   LOCATION:  NA                                   FACILITY:  [facility]   PHYSICIAN:  Duke Salvia. Marcelle Overlie, M.D.            DATE OF BIRTH:  January 04, 1927   DATE OF ADMISSION:  04/01/2002  DATE OF DISCHARGE:                                HISTORY & PHYSICAL   CHIEF COMPLAINT:  Cystocele, rectocele, SUI.   HISTORY OF PRESENT ILLNESS:  This 77 year old G6, P4, postmenopausal patient  with a history of a prior TAH/BSO on separate occasions who has been on  Premarin 0.625 for ERT was referred to me by Dr. Phylliss Bob for a three-month  history of urinary incontinence.  My initial examination showed a cystocele  and small rectocele with good cuff support.  Bimanual was negative.  She was  further evaluated by Dr. [name] who felt like she would be a candidate for a  pubovaginal sling and presents now for A&P repair with pubovaginal sling.  This procedure including risks of bleeding, infection, adjacent organ  injury, along with her expected recovery time were all discussed with her,  which she understands and accepts.   CURRENT MEDICATIONS:  1. Vitamins.  2. Calcium.  3. Premarin 0.625 daily.  4. Hydrochlorothiazide 25 mg daily.  5. Bextra 20 mg p.o. daily - b.i.d. p.r.n.  6. She has discontinued her Flexeril.  7. Also on lisinopril 10 mg daily.  8. Lorazepam 2 mg daily.  9. Synthroid 50 mcg daily.   PAST SURGICAL HISTORY:  1. Abdominal hysterectomy in 1970.  2. BSO in 1997.  3. Cholecystectomy in 1977.   REVIEW OF SYSTEMS:  Significant for being hypothyroid and for migraine  headaches.   FAMILY HISTORY:  Significant for mother and father with IDDM.   OBSTETRICAL HISTORY:  She has had five vaginal deliveries, two SABs.   PHYSICAL EXAMINATION:  VITAL SIGNS:  Temperature 98.2, blood pressure  140/80.  HEENT:  Unremarkable.  NECK:  Supple.  Without  masses.  LUNGS:  Clear.  CARDIOVASCULAR:  Regular rate and rhythm.  Without murmur, gallops, or rub.  BREASTS:  Without masses.  ABDOMEN:  Soft, flat, nontender.  PELVIC:  Normal external genitalia.  The cervix and uterus were surgically  absent.  On straining there was a moderate cystocele with a small rectocele.  The cuff support looked normal.  Bimanual was negative.  EXTREMITIES:  Unremarkable.  NEUROLOGIC:  Unremarkable.   IMPRESSION:  1. Cystocele, rectocele.  2. Stress urinary incontinence.   PLAN:  A&P repair, Dr. [name] to perform pubovaginal sling at the same time.  Procedure and risks reviewed as above.  [name], M.D.    RMH/MEDQ  D:  03/29/2002  T:  03/29/2002  Job:  [id number]

## 2010-10-26 LAB — COMPREHENSIVE METABOLIC PANEL
ALT: 25
AST: 30
Alkaline Phosphatase: 69
CO2: 31
Calcium: 9.9
GFR calc Af Amer: 48 — ABNORMAL LOW
GFR calc non Af Amer: 40 — ABNORMAL LOW
Potassium: 4.4
Sodium: 137
Total Protein: 7.2

## 2010-10-26 LAB — URINALYSIS, ROUTINE W REFLEX MICROSCOPIC
Bilirubin Urine: NEGATIVE
Ketones, ur: NEGATIVE
Nitrite: NEGATIVE
Protein, ur: NEGATIVE
Urobilinogen, UA: 0.2
pH: 7.5

## 2010-10-26 LAB — PROTIME-INR
INR: 2.3 — ABNORMAL HIGH
Prothrombin Time: 25.8 — ABNORMAL HIGH

## 2010-10-26 LAB — CBC
MCHC: 33.9
RBC: 4.12

## 2010-10-26 LAB — DIFFERENTIAL
Basophils Absolute: 0
Basophils Relative: 0
Monocytes Absolute: 0.6

## 2010-10-26 LAB — APTT: aPTT: 48 — ABNORMAL HIGH

## 2010-10-29 LAB — CBC
HCT: 26.7 — ABNORMAL LOW
MCHC: 34.2
MCV: 94.1
MCV: 94.5
Platelets: 288
RBC: 3.04 — ABNORMAL LOW
RDW: 14.5
RDW: 14.8
WBC: 11.2 — ABNORMAL HIGH

## 2010-10-29 LAB — BASIC METABOLIC PANEL
BUN: 27 — ABNORMAL HIGH
BUN: 32 — ABNORMAL HIGH
CO2: 27
Calcium: 8.6
Chloride: 102
Chloride: 97
Creatinine, Ser: 1.21 — ABNORMAL HIGH
Creatinine, Ser: 1.33 — ABNORMAL HIGH
GFR calc non Af Amer: 43 — ABNORMAL LOW
Glucose, Bld: 126 — ABNORMAL HIGH
Glucose, Bld: 144 — ABNORMAL HIGH
Potassium: 4.7

## 2010-10-29 LAB — PROTIME-INR
INR: 1.1
INR: 1.4
INR: 1.5
Prothrombin Time: 13.9
Prothrombin Time: 16.3 — ABNORMAL HIGH
Prothrombin Time: 17.7 — ABNORMAL HIGH

## 2010-11-13 LAB — PROTIME-INR: Prothrombin Time: 12.8

## 2010-11-14 LAB — CBC
HCT: 38.5
Hemoglobin: 13.1
RDW: 13.8

## 2010-11-14 LAB — DIFFERENTIAL
Basophils Absolute: 0
Eosinophils Relative: 3
Lymphocytes Relative: 23
Monocytes Absolute: 0.7

## 2010-11-14 LAB — BASIC METABOLIC PANEL
CO2: 33 — ABNORMAL HIGH
GFR calc non Af Amer: 43 — ABNORMAL LOW
Glucose, Bld: 99
Potassium: 4
Sodium: 142

## 2010-11-21 LAB — BASIC METABOLIC PANEL
CO2: 34 — ABNORMAL HIGH
Chloride: 97
Glucose, Bld: 108 — ABNORMAL HIGH
Potassium: 3.5
Sodium: 138

## 2010-11-21 LAB — POCT CARDIAC MARKERS
Myoglobin, poc: 92
Operator id: 288831
Troponin i, poc: <0.05

## 2010-11-21 LAB — CBC
HCT: 37.7
Hemoglobin: 12.6
Hemoglobin: 12.6
MCHC: 33.6
MCHC: 34
MCV: 91.4
MCV: 91.5
RBC: 4.07
RDW: 15.4 — ABNORMAL HIGH

## 2010-11-21 LAB — I-STAT 8, (EC8 V) (CONVERTED LAB)
BUN: 19
Bicarbonate: 33.8 — ABNORMAL HIGH
Glucose, Bld: 105 — ABNORMAL HIGH
Potassium: 3.6
TCO2: 35
pH, Ven: 7.436 — ABNORMAL HIGH

## 2010-11-21 LAB — POCT I-STAT CREATININE: Operator id: 288831

## 2011-08-22 ENCOUNTER — Ambulatory Visit
Admission: RE | Admit: 2011-08-22 | Discharge: 2011-08-22 | Disposition: A | Discharge status: Home / Self Care | Payer: Medicare Other | Source: Ambulatory Visit | Attending: Cardiology | Admitting: Cardiology

## 2011-08-22 ENCOUNTER — Other Ambulatory Visit: Payer: Self-pay | Admitting: Cardiology

## 2011-08-22 DIAGNOSIS — I4891 Unspecified atrial fibrillation: Secondary | ICD-10-CM

## 2011-09-27 ENCOUNTER — Other Ambulatory Visit: Payer: Self-pay | Admitting: Cardiology

## 2015-02-12 DIAGNOSIS — M545 Low back pain, unspecified: Secondary | ICD-10-CM | POA: Insufficient documentation

## 2015-02-12 DIAGNOSIS — F320 Major depressive disorder, single episode, mild: Secondary | ICD-10-CM | POA: Insufficient documentation

## 2015-02-12 DIAGNOSIS — F0390 Unspecified dementia without behavioral disturbance: Secondary | ICD-10-CM | POA: Insufficient documentation

## 2015-02-12 DIAGNOSIS — M810 Age-related osteoporosis without current pathological fracture: Secondary | ICD-10-CM | POA: Insufficient documentation

## 2015-02-12 DIAGNOSIS — R269 Unspecified abnormalities of gait and mobility: Secondary | ICD-10-CM | POA: Insufficient documentation

## 2015-02-12 DIAGNOSIS — D249 Benign neoplasm of unspecified breast: Secondary | ICD-10-CM | POA: Insufficient documentation

## 2015-02-12 DIAGNOSIS — G4700 Insomnia, unspecified: Secondary | ICD-10-CM | POA: Insufficient documentation

## 2015-02-12 DIAGNOSIS — I110 Hypertensive heart disease with heart failure: Secondary | ICD-10-CM | POA: Insufficient documentation

## 2015-02-12 DIAGNOSIS — E663 Overweight: Secondary | ICD-10-CM | POA: Insufficient documentation

## 2015-02-12 DIAGNOSIS — N3941 Urge incontinence: Secondary | ICD-10-CM | POA: Insufficient documentation

## 2015-02-12 DIAGNOSIS — R432 Parageusia: Secondary | ICD-10-CM | POA: Insufficient documentation

## 2015-02-12 DIAGNOSIS — M1990 Unspecified osteoarthritis, unspecified site: Secondary | ICD-10-CM | POA: Insufficient documentation

## 2015-02-12 DIAGNOSIS — H905 Unspecified sensorineural hearing loss: Secondary | ICD-10-CM | POA: Insufficient documentation

## 2015-02-12 DIAGNOSIS — I119 Hypertensive heart disease without heart failure: Secondary | ICD-10-CM | POA: Insufficient documentation

## 2015-02-12 DIAGNOSIS — Z7901 Long term (current) use of anticoagulants: Secondary | ICD-10-CM | POA: Insufficient documentation

## 2015-02-12 DIAGNOSIS — I499 Cardiac arrhythmia, unspecified: Secondary | ICD-10-CM | POA: Insufficient documentation

## 2015-02-12 DIAGNOSIS — R7301 Impaired fasting glucose: Secondary | ICD-10-CM | POA: Insufficient documentation

## 2015-02-12 DIAGNOSIS — E785 Hyperlipidemia, unspecified: Secondary | ICD-10-CM | POA: Insufficient documentation

## 2015-02-12 DIAGNOSIS — I480 Paroxysmal atrial fibrillation: Secondary | ICD-10-CM | POA: Insufficient documentation

## 2015-02-12 DIAGNOSIS — H903 Sensorineural hearing loss, bilateral: Secondary | ICD-10-CM | POA: Insufficient documentation

## 2015-02-12 DIAGNOSIS — I10 Essential (primary) hypertension: Secondary | ICD-10-CM | POA: Insufficient documentation

## 2015-02-12 DIAGNOSIS — E039 Hypothyroidism, unspecified: Secondary | ICD-10-CM | POA: Insufficient documentation

## 2015-04-18 DIAGNOSIS — Z0000 Encounter for general adult medical examination without abnormal findings: Secondary | ICD-10-CM | POA: Insufficient documentation

## 2015-04-18 HISTORY — DX: Encounter for general adult medical examination without abnormal findings: Z00.00

## 2015-08-07 ENCOUNTER — Ambulatory Visit
Admission: RE | Admit: 2015-08-07 | Discharge: 2015-08-07 | Disposition: A | Discharge status: Home / Self Care | Payer: Medicare HMO | Source: Ambulatory Visit | Attending: Cardiology | Admitting: Cardiology

## 2015-08-07 ENCOUNTER — Other Ambulatory Visit: Payer: Self-pay | Admitting: Cardiology

## 2015-08-07 DIAGNOSIS — I119 Hypertensive heart disease without heart failure: Secondary | ICD-10-CM

## 2015-08-30 ENCOUNTER — Other Ambulatory Visit: Payer: Self-pay | Admitting: Cardiology

## 2015-08-30 ENCOUNTER — Ambulatory Visit
Admission: RE | Admit: 2015-08-30 | Discharge: 2015-08-30 | Disposition: A | Discharge status: Home / Self Care | Payer: Medicare HMO | Source: Ambulatory Visit | Attending: Cardiology | Admitting: Cardiology

## 2015-08-30 DIAGNOSIS — R9389 Abnormal findings on diagnostic imaging of other specified body structures: Secondary | ICD-10-CM

## 2015-11-28 ENCOUNTER — Ambulatory Visit (INDEPENDENT_AMBULATORY_CARE_PROVIDER_SITE_OTHER): Payer: Self-pay | Admitting: Orthopaedic Surgery

## 2016-09-05 DIAGNOSIS — I83813 Varicose veins of bilateral lower extremities with pain: Secondary | ICD-10-CM | POA: Insufficient documentation

## 2016-10-30 DIAGNOSIS — I890 Lymphedema, not elsewhere classified: Secondary | ICD-10-CM | POA: Insufficient documentation

## 2016-10-30 DIAGNOSIS — I872 Venous insufficiency (chronic) (peripheral): Secondary | ICD-10-CM | POA: Insufficient documentation

## 2016-11-11 DIAGNOSIS — E669 Obesity, unspecified: Secondary | ICD-10-CM | POA: Insufficient documentation

## 2016-11-25 DIAGNOSIS — L989 Disorder of the skin and subcutaneous tissue, unspecified: Secondary | ICD-10-CM | POA: Insufficient documentation

## 2016-12-09 ENCOUNTER — Ambulatory Visit (INDEPENDENT_AMBULATORY_CARE_PROVIDER_SITE_OTHER): Payer: Medicare HMO | Admitting: Physician Assistant

## 2016-12-09 ENCOUNTER — Ambulatory Visit (INDEPENDENT_AMBULATORY_CARE_PROVIDER_SITE_OTHER): Payer: Medicare HMO

## 2016-12-09 ENCOUNTER — Encounter (INDEPENDENT_AMBULATORY_CARE_PROVIDER_SITE_OTHER): Payer: Self-pay | Admitting: Physician Assistant

## 2016-12-09 DIAGNOSIS — M25561 Pain in right knee: Secondary | ICD-10-CM

## 2016-12-09 DIAGNOSIS — M25562 Pain in left knee: Secondary | ICD-10-CM

## 2016-12-09 DIAGNOSIS — M79641 Pain in right hand: Secondary | ICD-10-CM | POA: Diagnosis not present

## 2016-12-09 DIAGNOSIS — G8929 Other chronic pain: Secondary | ICD-10-CM

## 2016-12-09 DIAGNOSIS — M79642 Pain in left hand: Secondary | ICD-10-CM

## 2016-12-09 MED ORDER — LIDOCAINE HCL 1 % IJ SOLN
3.0000 mL | INTRAMUSCULAR | Status: AC | PRN
  Administered 2016-12-09: 3 mL

## 2016-12-09 MED ORDER — METHYLPREDNISOLONE ACETATE 40 MG/ML IJ SUSP
40.0000 mg | INTRAMUSCULAR | Status: AC | PRN
  Administered 2016-12-09: 40 mg via INTRA_ARTICULAR

## 2016-12-09 NOTE — Progress Notes (Signed)
Office Visit Note   Patient: [name]           Date of Birth: 29-Feb-1926           MRN: [id number] Visit Date: 12/09/2016              Requested by: No referring provider defined for this encounter. PCP: No primary care provider on file.   Assessment & Plan: Visit Diagnoses:  1. Chronic pain of both knees   2. Bilateral hand pain     Plan: She is been asked not to take NSAIDs due to her cardiac condition.  Therefore recommended to [facility].  Also recommended paraffin baths for her hands.  We will see her back in 2 weeks to check her knees.  I did discuss with her quad strengthening.  Follow-Up Instructions: No Follow-up on file.   Orders:  Orders Placed This Encounter  Procedures  . Large Joint Inj  . XR Knee 1-2 Views Right  . XR Knee 1-2 Views Left   No orders of the defined types were placed in this encounter.     Procedures: Large Joint Inj on 12/09/2016 3:11 PM Indications: pain Details: 25 G 1.5 in needle, anterolateral approach  Arthrogram: No  Medications: 3 mL lidocaine 1 %; 40 mg methylPREDNISolone acetate 40 MG/ML Outcome: tolerated well, no immediate complications Consent was given by the parent.       Clinical Data: No additional findings.   Subjective: Chief Complaint  Patient presents with  . Right Knee - Pain  . Left Knee - Pain    HPI [name] is a 83 year old female comes in today with bilateral knee pain and bilateral hand pain.  She had no known injury to the knees.  She did undergo a left total knee arthroplasty by Dr. Lorin Mercy in 2009.  She states that she had a knee arthroscopy by Dr. Lorin Mercy on the right knee and was told that there was nothing else he can do.  She states this is been suffering with the pain.  Pain is becoming worse and both the right knee and the left knee.  She states both knees to buckle at times and therefore she uses a walker to ambulate.  She also is having bilateral hand pain states she has had pain in both  hands for years.  She has been told not to take NSAIDs.  Takes Tylenol with no relief. Review of Systems No fevers chills shortness of breath or chest pain  Objective: Vital Signs: There were no vitals taken for this visit.  Physical Exam  Constitutional: She is oriented to person, place, and time. She appears well-developed and well-nourished.  Neurological: She is alert and oriented to person, place, and time.  Skin: She is not diaphoretic.  Psychiatric: She has a normal mood and affect.  She ambulates with the use of a walker and needs help getting on and off exam table.  Ortho Exam She has good range of motion of both knees.  No instability valgus varus stressing of either knee.  Tenderness along medial joint line of the right knee.  Left knee she has tenderness along the medial and lateral joint line left knee with positive patella grinding.  There is no effusion abnormal warmth or erythema of either knee. Bilateral hands she has apparent arthritic changes.  Is unable to fully flex the fingers down to her palms.  She has good extension of the fingers.  Overall good sensation throughout.  No rashes  skin lesions ulcerations.  Radial pulses are 2+. Specialty Comments:  No specialty comments available.  Imaging: Xr Knee 1-2 Views Left  Result Date: 12/09/2016 Left knee 2 views: No acute fracture.  Status post left total knee arthroplasty with well-seated components no signs of hardware failure  Xr Knee 1-2 Views Right  Result Date: 12/09/2016 Right knee 2 views: Moderate to severe medial compartmental arthritic changes.  Severe patellofemoral changes.  No acute fractures.    PMFS History: Patient Active Problem List   Diagnosis Date Noted  . Skin lesion of chest wall 11/25/2016  . Obesity (BMI 30-39.9) 11/11/2016  . Lymphedema of both lower extremities 10/30/2016  . Venous insufficiency (chronic) (peripheral) 10/30/2016  . Varicose veins of bilateral lower extremities with pain  09/05/2016  . Abnormal gait 02/12/2015  . Osteoarthritis 02/12/2015  . Altered taste 02/12/2015  . Asymmetrical sensorineural hearing loss 02/12/2015  . Atrial fibrillation ([place]) 02/12/2015  . Benign essential hypertension 02/12/2015  . Benign hypertensive heart disease 02/12/2015  . Cardiac dysrhythmia 02/12/2015  . Dementia 02/12/2015  . Depression, major, single episode, mild ([place]) 02/12/2015  . Hyperlipemia 02/12/2015  . Fibroadenoma of breast 02/12/2015  . Hypothyroidism 02/12/2015  . IFG (impaired fasting glucose) 02/12/2015  . Insomnia 02/12/2015  . Low back pain 02/12/2015  . Osteoporosis 02/12/2015  . Overweight 02/12/2015  . Urge incontinence of urine 02/12/2015   History reviewed. No pertinent past medical history.  History reviewed. No pertinent family history.  History reviewed. No pertinent surgical history. Social History   Occupational History  . Not on file  Tobacco Use  . Smoking status: Never Smoker  . Smokeless tobacco: Never Used  Substance and Sexual Activity  . Alcohol use: Not on file  . Drug use: Not on file  . Sexual activity: Not on file

## 2016-12-10 ENCOUNTER — Ambulatory Visit (INDEPENDENT_AMBULATORY_CARE_PROVIDER_SITE_OTHER): Payer: Self-pay | Admitting: Orthopaedic Surgery

## 2017-01-02 ENCOUNTER — Ambulatory Visit (INDEPENDENT_AMBULATORY_CARE_PROVIDER_SITE_OTHER): Payer: Medicare HMO | Admitting: Physician Assistant

## 2017-02-04 DIAGNOSIS — C801 Malignant (primary) neoplasm, unspecified: Secondary | ICD-10-CM | POA: Insufficient documentation

## 2017-02-04 HISTORY — DX: Malignant (primary) neoplasm, unspecified: C80.1

## 2017-03-21 ENCOUNTER — Other Ambulatory Visit: Payer: Self-pay | Admitting: Family Medicine

## 2017-03-21 DIAGNOSIS — N6310 Unspecified lump in the right breast, unspecified quadrant: Secondary | ICD-10-CM

## 2017-03-25 ENCOUNTER — Ambulatory Visit
Admission: RE | Admit: 2017-03-25 | Discharge: 2017-03-25 | Disposition: A | Discharge status: Home / Self Care | Payer: Medicare HMO | Source: Ambulatory Visit | Attending: Family Medicine | Admitting: Family Medicine

## 2017-03-25 DIAGNOSIS — N6310 Unspecified lump in the right breast, unspecified quadrant: Secondary | ICD-10-CM

## 2017-03-31 ENCOUNTER — Ambulatory Visit: Payer: Self-pay | Admitting: Surgery

## 2017-03-31 DIAGNOSIS — Z170 Estrogen receptor positive status [ER+]: Principal | ICD-10-CM

## 2017-03-31 DIAGNOSIS — C50911 Malignant neoplasm of unspecified site of right female breast: Secondary | ICD-10-CM

## 2017-03-31 NOTE — H&P (View-Only) (Signed)
[name] Documented: 03/31/2017 9:39 AM Location: [facility] Patient #: [id number] DOB: 1926/03/15 Married / Language: [place] / Race: White Female  History of Present Illness Maisie Fus A. Debbie Yearick MD; 03/31/2017 10:46 AM) Patient words: Patient sent at the request of the [facility] for a mammographic abnormality involving her right breast. Patient underwent  mammogram showed a 1.3 cm mass right breast upper quadrant. Core biopsies done the breasts are range of ER positive PR positive HER-2/neu negative KI 5% LOW GRADE  right breast invasive LOBULAR  carcinoma. Patient denies history of breast pain breast mass or nipple discharge. She does have a family history of breast cancer in 2 sisters. They're in her 42s. Otherwise, she has no other complaints today. She is to undergo an ablation procedure by cardiologist later this week she states.                                 REASON FOR ADDENDUM, AMENDMENT OR CORRECTION: [id number]: Addendum for E-cadherin immunohistochemistry; see result in microscopic description below. ADDITIONAL INFORMATION: FLUORESCENCE IN-SITU HYBRIDIZATION Results: HER2 - NEGATIVE RATIO OF HER2/CEP17 SIGNALS 1.16 AVERAGE HER2 COPY NUMBER PER CELL 1.85 Reference Range: NEGATIVE HER2/CEP17 Ratio <2.0 and average HER2 copy number <4.0 EQUIVOCAL HER2/CEP17 Ratio <2.0 and average HER2 copy number >=4.0 and <6.0 POSITIVE HER2/CEP17 Ratio >=2.0 or <2.0 and average HER2 copy number >=6.0 Jimmy Picket MD Pathologist, Electronic Signature ( Signed 03/27/2017) PROGNOSTIC INDICATORS Results: IMMUNOHISTOCHEMICAL AND MORPHOMETRIC ANALYSIS PERFORMED MANUALLY Estrogen Receptor: 100%, POSITIVE, STRONG STAINING INTENSITY Progesterone Receptor: 90%, POSITIVE, STRONG STAINING INTENSITY Proliferation Marker Ki67: 5% REFERENCE RANGE ESTROGEN RECEPTOR NEGATIVE 0% POSITIVE =>1% REFERENCE RANGE PROGESTERONE RECEPTOR 1 of  3 Amended copy Addendum FINAL for Mooneyhan, Aliyah L ([id number]) ADDITIONAL INFORMATION:(continued) NEGATIVE 0% POSITIVE =>1% All controls stained appropriately Jimmy Picket MD Pathologist, Electronic Signature ( Signed 03/27/2017) FINAL DIAGNOSIS Diagnosis Breast, right, needle core biopsy, 5 o'clock - INVASIVE MAMMARY CARCINOMA - SEE COMMENT Microscopic Comment.  The patient is a 84 year old female.   Past Surgical History Marcelino Duster R. Brooks, CMA; 03/31/2017 9:40 AM) Breast Biopsy Left. Gallbladder Surgery - Open Hysterectomy (not due to cancer) - Complete Knee Surgery Bilateral.  Diagnostic Studies History Marcelino Duster R. Brooks, CMA; 03/31/2017 9:40 AM) Colonoscopy never Mammogram within last year Pap Smear >5 years ago  Allergies Marcelino Duster R. Brooks, CMA; 03/31/2017 9:40 AM) No Known Drug Allergies [03/31/2017]:  Medication History Marcelino Duster R. Brooks, CMA; 03/31/2017 9:44 AM) Amiodarone HCl (200MG  Tablet, Oral) Active. AmLODIPine Besylate (5MG  Tablet, Oral) Active. Albuterol (90MCG/ACT Aerosol Soln, Inhalation) Active. Aspirin (81MG  Tablet, Oral) Active. Calcium 500/Vitamin D (Oral) Specific strength unknown - Active. Cephalexin (500MG  Capsule, Oral) Active. Donepezil HCl (10MG  Tablet, Oral) Active. Furosemide (40MG  Tablet, Oral) Active. Horse Chestnut (300MG  Tablet, Oral) Active. Norco (5-325MG  Tablet, Oral) Active. Levothyroxine Sodium ( Tablet, Oral) Active. LevoFLOXacin (750MG  Tablet, Oral) Active. Lisinopril (40MG  Tablet, Oral) Active. LORazepam (2MG  Tablet, Oral) Active. Memantine HCl (10MG  Tablet, Oral) Active. Mupirocin (2% Ointment, External) Active. Oxybutynin Chloride (5MG  Tablet, Oral) Active. Sertraline HCl (50MG  Tablet, Oral) Active. Simvastatin (20MG  Tablet, Oral) Active. Prenatal Multivit-Min-Fe-FA (Oral) Specific strength unknown - Active. Vitamin C (1000MG  Tablet, Oral) Active. MiraLax (Oral)  Active. Medications Reconciled  Social History Marcelino Duster R. Brooks, CMA; 03/31/2017 9:40 AM) Caffeine use Coffee. No drug use Tobacco use Never smoker.  Family History Marcelino Duster R. Shon Baton, CMA; 03/31/2017 9:40 AM) Arthritis Daughter, Father, Mother, Sister, Son. Breast Cancer Sister. Cerebrovascular Accident  Father. Heart Disease Mother. Hypertension Mother.  Pregnancy / Birth History Marcelino Duster R. Shon Baton, CMA; 03/31/2017 9:40 AM) Age at menarche 17 years. Gravida 6 Maternal age 44-20 Para 5  Other Problems Marcelino Duster R. Brooks, CMA; 03/31/2017 9:40 AM) Arthritis Back Pain Bladder Problems Breast Cancer Depression High blood pressure Hypercholesterolemia Oophorectomy Bilateral. Thyroid Disease     Review of Systems [facility] R. Brooks CMA; 03/31/2017 9:40 AM) General Present- Fatigue. Not Present- Appetite Loss, Chills, Fever, Night Sweats, Weight Gain and Weight Loss. Skin Present- Non-Healing Wounds. Not Present- Change in Wart/Mole, Dryness, Hives, Jaundice, New Lesions, Rash and Ulcer. HEENT Present- Hearing Loss and Wears glasses/contact lenses. Not Present- Earache, Hoarseness, Nose Bleed, Oral Ulcers, Ringing in the Ears, Seasonal Allergies, Sinus Pain, Sore Throat, Visual Disturbances and Yellow Eyes. Cardiovascular Present- Difficulty Breathing Lying Down, Shortness of Breath and Swelling of Extremities. Not Present- Chest Pain, Leg Cramps, Palpitations and Rapid Heart Rate. Gastrointestinal Present- Difficulty Swallowing. Not Present- Abdominal Pain, Bloating, Bloody Stool, Change in Bowel Habits, Chronic diarrhea, Constipation, Excessive gas, Gets full quickly at meals, Hemorrhoids, Indigestion, Nausea, Rectal Pain and Vomiting. Female Genitourinary Present- Frequency and Urgency. Not Present- Nocturia, Painful Urination and Pelvic Pain. Musculoskeletal Present- Back Pain, Joint Pain, Joint Stiffness, Muscle Pain, Muscle Weakness and Swelling of  Extremities. Neurological Present- Decreased Memory and Trouble walking. Not Present- Fainting, Headaches, Numbness, Seizures, Tingling, Tremor and Weakness. Psychiatric Present- Depression. Not Present- Anxiety, Bipolar, Change in Sleep Pattern, Fearful and Frequent crying. Hematology Present- Blood Thinners. Not Present- Easy Bruising, Excessive bleeding, Gland problems, HIV and Persistent Infections.  Vitals [employer] R. Brooks CMA; 03/31/2017 9:40 AM) 03/31/2017 9:39 AM Weight: 184.38 lb Height: 62in Body Surface Area: 1.85 m Body Mass Index: 33.72 kg/m  BP: 136/70 (Sitting, Left Arm, Standard)      Physical Exam (Zakyla Tonche A. Lametria Klunk MD; 03/31/2017 10:46 AM)  General Mental Status-Alert. General Appearance-Consistent with stated age. Hydration-Well hydrated. Voice-Normal.  Head and Neck Head-normocephalic, atraumatic with no lesions or palpable masses. Trachea-midline. Thyroid Gland Characteristics - normal size and consistency.  Chest and Lung Exam Chest and lung exam reveals -quiet, even and easy respiratory effort with no use of accessory muscles and on auscultation, normal breath sounds, no adventitious sounds and normal vocal resonance. Inspection Chest Wall - Normal. Back - normal.  Breast Breast - Left-Symmetric, Non Tender, No Biopsy scars, no Dimpling, No Inflammation, No Lumpectomy scars, No Mastectomy scars, No Peau d' Orange. Breast - Right-Symmetric, Non Tender, No Biopsy scars, no Dimpling, No Inflammation, No Lumpectomy scars, No Mastectomy scars, No Peau d' Orange. Breast Lump-No Palpable Breast Mass.  Cardiovascular Note: SR no aberrant beats  Neurologic Neurologic evaluation reveals -alert and oriented x 3 with no impairment of recent or remote memory. Mental Status-Normal.  Musculoskeletal Normal Exam - Left-Upper Extremity Strength Normal and Lower Extremity Strength Normal. Normal Exam - Right-Upper Extremity  Strength Normal and Lower Extremity Strength Normal.  Lymphatic Head & Neck  General Head & Neck Lymphatics: Bilateral - Description - Normal. Axillary  General Axillary Region: Bilateral - Description - Normal. Tenderness - Non Tender.    Assessment & Plan (Ramsie Ostrander A. Lilianah Buffin MD; 03/31/2017 10:47 AM)  CANCER OF RIGHT BREAST, STAGE 1, ESTROGEN RECEPTOR POSITIVE (C50.911) Impression: Discussed options of breast conservation versus antiestrogen therapy only. Discussed possible stent mastectomy as well. She will be a good breast conserving candidate I think her health is good enough for that. I will refer to medical and radiation ONCOLOGY  for opinions. I will await her  cardiac procedures to be complete but she is interested in lumpectomy on the right. I would not do sentinel lymph node mapping due to size, low grade and low risk of ALN involvement and overall health.  Risk of lumpectomy include bleeding, infection, seroma, more surgery, use of seed/wire, wound care, cosmetic deformity and the need for other treatments, death , blood clots, death. Pt agrees to proceed. nodes in her case given her small tumor normal examination and hormone status as well as low growth rate.  Current Plans You are being scheduled for surgery- Our schedulers will call you.  You should hear from our office's scheduling department within 5 working days about the location, date, and time of surgery. We try to make accommodations for patient's preferences in scheduling surgery, but sometimes the OR schedule or the surgeon's schedule prevents [place] from making those accommodations.  If you have not heard from our office [phone]) in 5 working days, call the office and ask for your surgeon's nurse.  If you have other questions about your diagnosis, plan, or surgery, call the office and ask for your surgeon's nurse.  Pt Education - [facility] Breast Cancer Information Given - [employer] "Breast Journey" Package Pt Education -  [facility] Breast Pains Education Pt Education - [facility] Breast Biopsy HCI: discussed with patient and provided information. Pt Education - [facility] breast cancer surgery: discussed with patient and provided information. We discussed the staging and pathophysiology of breast cancer. We discussed all of the different options for treatment for breast cancer including surgery, chemotherapy, radiation therapy, Herceptin, and antiestrogen therapy. We discussed a sentinel lymph node biopsy as she does not appear to having lymph node involvement right now. We discussed the performance of that with injection of radioactive tracer and blue dye. We discussed that she would have an incision underneath her axillary hairline. We discussed that there is a bout a 10-20% chance of having a positive node with a sentinel lymph node biopsy and we will await the permanent pathology to make any other first further decisions in terms of her treatment. One of these options might be to return to the operating room to perform an axillary lymph node dissection. We discussed about a 1-2% risk lifetime of chronic shoulder pain as well as lymphedema associated with a sentinel lymph node biopsy. We discussed the options for treatment of the breast cancer which included lumpectomy versus a mastectomy. We discussed the performance of the lumpectomy with a wire placement. We discussed a 10-20% chance of a positive margin requiring reexcision in the operating room. We also discussed that she may need radiation therapy or antiestrogen therapy or both if she undergoes lumpectomy. We discussed the mastectomy and the postoperative care for that as well. We discussed that there is no difference in her survival whether she undergoes lumpectomy with radiation therapy or antiestrogen therapy versus a mastectomy. There is a slight difference in the local recurrence rate being 3-5% with lumpectomy and about 1% with a mastectomy. We discussed the risks of operation  including bleeding, infection, possible reoperation. She understands her further therapy will be based on what her stages at the time of her operation.  Referred to Radiation Oncology, for evaluation and follow up (Radiation Oncology). Routine. Referred to Oncology, for evaluation and follow up (Oncology). Routine. Referred to Genetic Counseling, for evaluation and follow up [employer]).

## 2017-03-31 NOTE — H&P (Addendum)
[name] Documented: 03/31/2017 9:39 AM Location: [facility] Patient #: [id number] DOB: 1926/03/15 Married / Language: [place] / Race: White Female  History of Present Illness Maisie Fus A. Debbie Yearick MD; 03/31/2017 10:46 AM) Patient words: Patient sent at the request of the [facility] for a mammographic abnormality involving her right breast. Patient underwent  mammogram showed a 1.3 cm mass right breast upper quadrant. Core biopsies done the breasts are range of ER positive PR positive HER-2/neu negative KI 5% LOW GRADE  right breast invasive LOBULAR  carcinoma. Patient denies history of breast pain breast mass or nipple discharge. She does have a family history of breast cancer in 2 sisters. They're in her 42s. Otherwise, she has no other complaints today. She is to undergo an ablation procedure by cardiologist later this week she states.                                 REASON FOR ADDENDUM, AMENDMENT OR CORRECTION: [id number]: Addendum for E-cadherin immunohistochemistry; see result in microscopic description below. ADDITIONAL INFORMATION: FLUORESCENCE IN-SITU HYBRIDIZATION Results: HER2 - NEGATIVE RATIO OF HER2/CEP17 SIGNALS 1.16 AVERAGE HER2 COPY NUMBER PER CELL 1.85 Reference Range: NEGATIVE HER2/CEP17 Ratio <2.0 and average HER2 copy number <4.0 EQUIVOCAL HER2/CEP17 Ratio <2.0 and average HER2 copy number >=4.0 and <6.0 POSITIVE HER2/CEP17 Ratio >=2.0 or <2.0 and average HER2 copy number >=6.0 Jimmy Picket MD Pathologist, Electronic Signature ( Signed 03/27/2017) PROGNOSTIC INDICATORS Results: IMMUNOHISTOCHEMICAL AND MORPHOMETRIC ANALYSIS PERFORMED MANUALLY Estrogen Receptor: 100%, POSITIVE, STRONG STAINING INTENSITY Progesterone Receptor: 90%, POSITIVE, STRONG STAINING INTENSITY Proliferation Marker Ki67: 5% REFERENCE RANGE ESTROGEN RECEPTOR NEGATIVE 0% POSITIVE =>1% REFERENCE RANGE PROGESTERONE RECEPTOR 1 of  3 Amended copy Addendum FINAL for Mooneyhan, Aliyah L ([id number]) ADDITIONAL INFORMATION:(continued) NEGATIVE 0% POSITIVE =>1% All controls stained appropriately Jimmy Picket MD Pathologist, Electronic Signature ( Signed 03/27/2017) FINAL DIAGNOSIS Diagnosis Breast, right, needle core biopsy, 5 o'clock - INVASIVE MAMMARY CARCINOMA - SEE COMMENT Microscopic Comment.  The patient is a 84 year old female.   Past Surgical History Marcelino Duster R. Brooks, CMA; 03/31/2017 9:40 AM) Breast Biopsy Left. Gallbladder Surgery - Open Hysterectomy (not due to cancer) - Complete Knee Surgery Bilateral.  Diagnostic Studies History Marcelino Duster R. Brooks, CMA; 03/31/2017 9:40 AM) Colonoscopy never Mammogram within last year Pap Smear >5 years ago  Allergies Marcelino Duster R. Brooks, CMA; 03/31/2017 9:40 AM) No Known Drug Allergies [03/31/2017]:  Medication History Marcelino Duster R. Brooks, CMA; 03/31/2017 9:44 AM) Amiodarone HCl (200MG  Tablet, Oral) Active. AmLODIPine Besylate (5MG  Tablet, Oral) Active. Albuterol (90MCG/ACT Aerosol Soln, Inhalation) Active. Aspirin (81MG  Tablet, Oral) Active. Calcium 500/Vitamin D (Oral) Specific strength unknown - Active. Cephalexin (500MG  Capsule, Oral) Active. Donepezil HCl (10MG  Tablet, Oral) Active. Furosemide (40MG  Tablet, Oral) Active. Horse Chestnut (300MG  Tablet, Oral) Active. Norco (5-325MG  Tablet, Oral) Active. Levothyroxine Sodium ( Tablet, Oral) Active. LevoFLOXacin (750MG  Tablet, Oral) Active. Lisinopril (40MG  Tablet, Oral) Active. LORazepam (2MG  Tablet, Oral) Active. Memantine HCl (10MG  Tablet, Oral) Active. Mupirocin (2% Ointment, External) Active. Oxybutynin Chloride (5MG  Tablet, Oral) Active. Sertraline HCl (50MG  Tablet, Oral) Active. Simvastatin (20MG  Tablet, Oral) Active. Prenatal Multivit-Min-Fe-FA (Oral) Specific strength unknown - Active. Vitamin C (1000MG  Tablet, Oral) Active. MiraLax (Oral)  Active. Medications Reconciled  Social History Marcelino Duster R. Brooks, CMA; 03/31/2017 9:40 AM) Caffeine use Coffee. No drug use Tobacco use Never smoker.  Family History Marcelino Duster R. Shon Baton, CMA; 03/31/2017 9:40 AM) Arthritis Daughter, Father, Mother, Sister, Son. Breast Cancer Sister. Cerebrovascular Accident  Father. Heart Disease Mother. Hypertension Mother.  Pregnancy / Birth History Marcelino Duster R. Shon Baton, CMA; 03/31/2017 9:40 AM) Age at menarche 17 years. Gravida 6 Maternal age 44-20 Para 5  Other Problems Marcelino Duster R. Brooks, CMA; 03/31/2017 9:40 AM) Arthritis Back Pain Bladder Problems Breast Cancer Depression High blood pressure Hypercholesterolemia Oophorectomy Bilateral. Thyroid Disease     Review of Systems [facility] R. Brooks CMA; 03/31/2017 9:40 AM) General Present- Fatigue. Not Present- Appetite Loss, Chills, Fever, Night Sweats, Weight Gain and Weight Loss. Skin Present- Non-Healing Wounds. Not Present- Change in Wart/Mole, Dryness, Hives, Jaundice, New Lesions, Rash and Ulcer. HEENT Present- Hearing Loss and Wears glasses/contact lenses. Not Present- Earache, Hoarseness, Nose Bleed, Oral Ulcers, Ringing in the Ears, Seasonal Allergies, Sinus Pain, Sore Throat, Visual Disturbances and Yellow Eyes. Cardiovascular Present- Difficulty Breathing Lying Down, Shortness of Breath and Swelling of Extremities. Not Present- Chest Pain, Leg Cramps, Palpitations and Rapid Heart Rate. Gastrointestinal Present- Difficulty Swallowing. Not Present- Abdominal Pain, Bloating, Bloody Stool, Change in Bowel Habits, Chronic diarrhea, Constipation, Excessive gas, Gets full quickly at meals, Hemorrhoids, Indigestion, Nausea, Rectal Pain and Vomiting. Female Genitourinary Present- Frequency and Urgency. Not Present- Nocturia, Painful Urination and Pelvic Pain. Musculoskeletal Present- Back Pain, Joint Pain, Joint Stiffness, Muscle Pain, Muscle Weakness and Swelling of  Extremities. Neurological Present- Decreased Memory and Trouble walking. Not Present- Fainting, Headaches, Numbness, Seizures, Tingling, Tremor and Weakness. Psychiatric Present- Depression. Not Present- Anxiety, Bipolar, Change in Sleep Pattern, Fearful and Frequent crying. Hematology Present- Blood Thinners. Not Present- Easy Bruising, Excessive bleeding, Gland problems, HIV and Persistent Infections.  Vitals [employer] R. Brooks CMA; 03/31/2017 9:40 AM) 03/31/2017 9:39 AM Weight: 184.38 lb Height: 62in Body Surface Area: 1.85 m Body Mass Index: 33.72 kg/m  BP: 136/70 (Sitting, Left Arm, Standard)      Physical Exam (Zakyla Tonche A. Lametria Klunk MD; 03/31/2017 10:46 AM)  General Mental Status-Alert. General Appearance-Consistent with stated age. Hydration-Well hydrated. Voice-Normal.  Head and Neck Head-normocephalic, atraumatic with no lesions or palpable masses. Trachea-midline. Thyroid Gland Characteristics - normal size and consistency.  Chest and Lung Exam Chest and lung exam reveals -quiet, even and easy respiratory effort with no use of accessory muscles and on auscultation, normal breath sounds, no adventitious sounds and normal vocal resonance. Inspection Chest Wall - Normal. Back - normal.  Breast Breast - Left-Symmetric, Non Tender, No Biopsy scars, no Dimpling, No Inflammation, No Lumpectomy scars, No Mastectomy scars, No Peau d' Orange. Breast - Right-Symmetric, Non Tender, No Biopsy scars, no Dimpling, No Inflammation, No Lumpectomy scars, No Mastectomy scars, No Peau d' Orange. Breast Lump-No Palpable Breast Mass.  Cardiovascular Note: SR no aberrant beats  Neurologic Neurologic evaluation reveals -alert and oriented x 3 with no impairment of recent or remote memory. Mental Status-Normal.  Musculoskeletal Normal Exam - Left-Upper Extremity Strength Normal and Lower Extremity Strength Normal. Normal Exam - Right-Upper Extremity  Strength Normal and Lower Extremity Strength Normal.  Lymphatic Head & Neck  General Head & Neck Lymphatics: Bilateral - Description - Normal. Axillary  General Axillary Region: Bilateral - Description - Normal. Tenderness - Non Tender.    Assessment & Plan (Ramsie Ostrander A. Lilianah Buffin MD; 03/31/2017 10:47 AM)  CANCER OF RIGHT BREAST, STAGE 1, ESTROGEN RECEPTOR POSITIVE (C50.911) Impression: Discussed options of breast conservation versus antiestrogen therapy only. Discussed possible stent mastectomy as well. She will be a good breast conserving candidate I think her health is good enough for that. I will refer to medical and radiation ONCOLOGY  for opinions. I will await her  cardiac procedures to be complete but she is interested in lumpectomy on the right. I would not do sentinel lymph node mapping due to size, low grade and low risk of ALN involvement and overall health.  Risk of lumpectomy include bleeding, infection, seroma, more surgery, use of seed/wire, wound care, cosmetic deformity and the need for other treatments, death , blood clots, death. Pt agrees to proceed. nodes in her case given her small tumor normal examination and hormone status as well as low growth rate.  Current Plans You are being scheduled for surgery- Our schedulers will call you.  You should hear from our office's scheduling department within 5 working days about the location, date, and time of surgery. We try to make accommodations for patient's preferences in scheduling surgery, but sometimes the OR schedule or the surgeon's schedule prevents [place] from making those accommodations.  If you have not heard from our office [phone]) in 5 working days, call the office and ask for your surgeon's nurse.  If you have other questions about your diagnosis, plan, or surgery, call the office and ask for your surgeon's nurse.  Pt Education - [facility] Breast Cancer Information Given - [employer] "Breast Journey" Package Pt Education -  [facility] Breast Pains Education Pt Education - [facility] Breast Biopsy HCI: discussed with patient and provided information. Pt Education - [facility] breast cancer surgery: discussed with patient and provided information. We discussed the staging and pathophysiology of breast cancer. We discussed all of the different options for treatment for breast cancer including surgery, chemotherapy, radiation therapy, Herceptin, and antiestrogen therapy. We discussed a sentinel lymph node biopsy as she does not appear to having lymph node involvement right now. We discussed the performance of that with injection of radioactive tracer and blue dye. We discussed that she would have an incision underneath her axillary hairline. We discussed that there is a bout a 10-20% chance of having a positive node with a sentinel lymph node biopsy and we will await the permanent pathology to make any other first further decisions in terms of her treatment. One of these options might be to return to the operating room to perform an axillary lymph node dissection. We discussed about a 1-2% risk lifetime of chronic shoulder pain as well as lymphedema associated with a sentinel lymph node biopsy. We discussed the options for treatment of the breast cancer which included lumpectomy versus a mastectomy. We discussed the performance of the lumpectomy with a wire placement. We discussed a 10-20% chance of a positive margin requiring reexcision in the operating room. We also discussed that she may need radiation therapy or antiestrogen therapy or both if she undergoes lumpectomy. We discussed the mastectomy and the postoperative care for that as well. We discussed that there is no difference in her survival whether she undergoes lumpectomy with radiation therapy or antiestrogen therapy versus a mastectomy. There is a slight difference in the local recurrence rate being 3-5% with lumpectomy and about 1% with a mastectomy. We discussed the risks of operation  including bleeding, infection, possible reoperation. She understands her further therapy will be based on what her stages at the time of her operation.  Referred to Radiation Oncology, for evaluation and follow up (Radiation Oncology). Routine. Referred to Oncology, for evaluation and follow up (Oncology). Routine. Referred to Genetic Counseling, for evaluation and follow up [employer]).

## 2017-04-08 ENCOUNTER — Other Ambulatory Visit: Payer: Self-pay | Admitting: Surgery

## 2017-04-08 DIAGNOSIS — Z170 Estrogen receptor positive status [ER+]: Principal | ICD-10-CM

## 2017-04-08 DIAGNOSIS — C50911 Malignant neoplasm of unspecified site of right female breast: Secondary | ICD-10-CM

## 2017-04-09 ENCOUNTER — Encounter (HOSPITAL_BASED_OUTPATIENT_CLINIC_OR_DEPARTMENT_OTHER): Payer: Self-pay | Admitting: *Deleted

## 2017-04-09 ENCOUNTER — Other Ambulatory Visit: Payer: Self-pay

## 2017-04-09 NOTE — Progress Notes (Signed)
Office note with cards clearance sent from Dr Thurman Coyer office with EKG and ECHO results.

## 2017-04-10 ENCOUNTER — Encounter (HOSPITAL_BASED_OUTPATIENT_CLINIC_OR_DEPARTMENT_OTHER)
Admission: RE | Admit: 2017-04-10 | Discharge: 2017-04-10 | Disposition: A | Discharge status: Home / Self Care | Payer: Medicare HMO | Source: Ambulatory Visit | Attending: Surgery | Admitting: Surgery

## 2017-04-10 DIAGNOSIS — Z01818 Encounter for other preprocedural examination: Secondary | ICD-10-CM | POA: Diagnosis not present

## 2017-04-10 LAB — BASIC METABOLIC PANEL
Anion gap: 11 (ref 5–15)
BUN: 17 mg/dL (ref 6–20)
CO2: 28 mmol/L (ref 22–32)
Calcium: 9.1 mg/dL (ref 8.9–10.3)
Chloride: 102 mmol/L (ref 101–111)
Creatinine, Ser: 1.09 mg/dL — ABNORMAL HIGH (ref 0.44–1.00)
GFR calc Af Amer: 51 mL/min — ABNORMAL LOW (ref >60)
GFR, EST NON AFRICAN AMERICAN: 44 mL/min — AB (ref >60)
GLUCOSE: 101 mg/dL — AB (ref 65–99)
POTASSIUM: 3.5 mmol/L (ref 3.5–5.1)
Sodium: 141 mmol/L (ref 135–145)

## 2017-04-14 ENCOUNTER — Ambulatory Visit
Admission: RE | Admit: 2017-04-14 | Discharge: 2017-04-14 | Disposition: A | Discharge status: Home / Self Care | Payer: Medicare HMO | Source: Ambulatory Visit | Attending: Surgery | Admitting: Surgery

## 2017-04-14 ENCOUNTER — Other Ambulatory Visit: Payer: Self-pay | Admitting: Surgery

## 2017-04-14 DIAGNOSIS — Z170 Estrogen receptor positive status [ER+]: Principal | ICD-10-CM

## 2017-04-14 DIAGNOSIS — C50911 Malignant neoplasm of unspecified site of right female breast: Secondary | ICD-10-CM

## 2017-04-15 ENCOUNTER — Encounter (HOSPITAL_BASED_OUTPATIENT_CLINIC_OR_DEPARTMENT_OTHER): Payer: Self-pay | Admitting: Anesthesiology

## 2017-04-15 ENCOUNTER — Ambulatory Visit (HOSPITAL_BASED_OUTPATIENT_CLINIC_OR_DEPARTMENT_OTHER)
Admission: RE | Admit: 2017-04-15 | Discharge: 2017-04-15 | Disposition: A | Discharge status: Home / Self Care | Payer: Medicare HMO | Source: Ambulatory Visit | Attending: Surgery | Admitting: Surgery

## 2017-04-15 ENCOUNTER — Ambulatory Visit
Admission: RE | Admit: 2017-04-15 | Discharge: 2017-04-15 | Disposition: A | Discharge status: Home / Self Care | Payer: Medicare HMO | Source: Ambulatory Visit | Attending: Surgery | Admitting: Surgery

## 2017-04-15 ENCOUNTER — Ambulatory Visit (HOSPITAL_BASED_OUTPATIENT_CLINIC_OR_DEPARTMENT_OTHER): Payer: Medicare HMO | Admitting: Anesthesiology

## 2017-04-15 ENCOUNTER — Other Ambulatory Visit: Payer: Self-pay

## 2017-04-15 ENCOUNTER — Encounter (HOSPITAL_BASED_OUTPATIENT_CLINIC_OR_DEPARTMENT_OTHER)
Admission: RE | Disposition: A | Discharge status: Home / Self Care | Payer: Self-pay | Source: Ambulatory Visit | Attending: Surgery

## 2017-04-15 DIAGNOSIS — C50911 Malignant neoplasm of unspecified site of right female breast: Secondary | ICD-10-CM

## 2017-04-15 DIAGNOSIS — M1990 Unspecified osteoarthritis, unspecified site: Secondary | ICD-10-CM | POA: Diagnosis not present

## 2017-04-15 DIAGNOSIS — Z803 Family history of malignant neoplasm of breast: Secondary | ICD-10-CM | POA: Insufficient documentation

## 2017-04-15 DIAGNOSIS — I10 Essential (primary) hypertension: Secondary | ICD-10-CM | POA: Diagnosis not present

## 2017-04-15 DIAGNOSIS — Z170 Estrogen receptor positive status [ER+]: Principal | ICD-10-CM

## 2017-04-15 DIAGNOSIS — F329 Major depressive disorder, single episode, unspecified: Secondary | ICD-10-CM | POA: Insufficient documentation

## 2017-04-15 DIAGNOSIS — Z8249 Family history of ischemic heart disease and other diseases of the circulatory system: Secondary | ICD-10-CM | POA: Insufficient documentation

## 2017-04-15 DIAGNOSIS — Z7982 Long term (current) use of aspirin: Secondary | ICD-10-CM | POA: Diagnosis not present

## 2017-04-15 DIAGNOSIS — Z9049 Acquired absence of other specified parts of digestive tract: Secondary | ICD-10-CM | POA: Diagnosis not present

## 2017-04-15 DIAGNOSIS — Z79899 Other long term (current) drug therapy: Secondary | ICD-10-CM | POA: Diagnosis not present

## 2017-04-15 DIAGNOSIS — Z90710 Acquired absence of both cervix and uterus: Secondary | ICD-10-CM | POA: Insufficient documentation

## 2017-04-15 DIAGNOSIS — C50311 Malignant neoplasm of lower-inner quadrant of right female breast: Secondary | ICD-10-CM | POA: Diagnosis present

## 2017-04-15 DIAGNOSIS — Z823 Family history of stroke: Secondary | ICD-10-CM | POA: Diagnosis not present

## 2017-04-15 DIAGNOSIS — H9190 Unspecified hearing loss, unspecified ear: Secondary | ICD-10-CM | POA: Diagnosis not present

## 2017-04-15 DIAGNOSIS — I4891 Unspecified atrial fibrillation: Secondary | ICD-10-CM | POA: Insufficient documentation

## 2017-04-15 DIAGNOSIS — M549 Dorsalgia, unspecified: Secondary | ICD-10-CM | POA: Diagnosis not present

## 2017-04-15 DIAGNOSIS — E7800 Pure hypercholesterolemia, unspecified: Secondary | ICD-10-CM | POA: Diagnosis not present

## 2017-04-15 HISTORY — DX: Edema, unspecified: R60.9

## 2017-04-15 HISTORY — DX: Hypothyroidism, unspecified: E03.9

## 2017-04-15 HISTORY — DX: Localized edema: R60.0

## 2017-04-15 HISTORY — PX: BREAST LUMPECTOMY WITH RADIOACTIVE SEED LOCALIZATION: SHX6424

## 2017-04-15 HISTORY — DX: Essential (primary) hypertension: I10

## 2017-04-15 HISTORY — DX: Fibromyalgia: M79.7

## 2017-04-15 HISTORY — DX: Unspecified atrial fibrillation: I48.91

## 2017-04-15 HISTORY — DX: Malignant (primary) neoplasm, unspecified: C80.1

## 2017-04-15 HISTORY — DX: Unspecified dementia, unspecified severity, without behavioral disturbance, psychotic disturbance, mood disturbance, and anxiety: F03.90

## 2017-04-15 HISTORY — DX: Anxiety disorder, unspecified: F41.9

## 2017-04-15 SURGERY — BREAST LUMPECTOMY WITH RADIOACTIVE SEED LOCALIZATION
Anesthesia: General | Site: Breast | Laterality: Right

## 2017-04-15 MED ORDER — DEXTROSE 5 % IV SOLN: 3.0000 g | INTRAVENOUS | Status: DC

## 2017-04-15 MED ORDER — PHENYLEPHRINE 40 MCG/ML (10ML) SYRINGE FOR IV PUSH (FOR BLOOD PRESSURE SUPPORT)
PREFILLED_SYRINGE | INTRAVENOUS | Status: AC
  Filled 2017-04-15: qty 10

## 2017-04-15 MED ORDER — PHENYLEPHRINE HCL 10 MG/ML IJ SOLN
INTRAMUSCULAR | Status: DC | PRN
  Administered 2017-04-15: 80 ug via INTRAVENOUS

## 2017-04-15 MED ORDER — PROPOFOL 10 MG/ML IV BOLUS
INTRAVENOUS | Status: AC
  Filled 2017-04-15: qty 20

## 2017-04-15 MED ORDER — LIDOCAINE HCL (CARDIAC) 20 MG/ML IV SOLN
INTRAVENOUS | Status: AC
  Filled 2017-04-15: qty 5

## 2017-04-15 MED ORDER — MIDAZOLAM HCL 2 MG/2ML IJ SOLN
1.0000 mg | Freq: Once | INTRAMUSCULAR | Status: AC
  Administered 2017-04-15: 1 mg via INTRAVENOUS

## 2017-04-15 MED ORDER — BUPIVACAINE-EPINEPHRINE (PF) 0.5% -1:200000 IJ SOLN
INTRAMUSCULAR | Status: AC
  Filled 2017-04-15: qty 30

## 2017-04-15 MED ORDER — PROPOFOL 10 MG/ML IV BOLUS
INTRAVENOUS | Status: DC | PRN
  Administered 2017-04-15: 100 mg via INTRAVENOUS

## 2017-04-15 MED ORDER — BUPIVACAINE-EPINEPHRINE 0.5% -1:200000 IJ SOLN
INTRAMUSCULAR | Status: DC | PRN
  Administered 2017-04-15: 20 mL

## 2017-04-15 MED ORDER — CEFAZOLIN SODIUM-DEXTROSE 2-4 GM/100ML-% IV SOLN
INTRAVENOUS | Status: AC
  Filled 2017-04-15: qty 100

## 2017-04-15 MED ORDER — FENTANYL CITRATE (PF) 100 MCG/2ML IJ SOLN
INTRAMUSCULAR | Status: AC
  Filled 2017-04-15: qty 2

## 2017-04-15 MED ORDER — CHLORHEXIDINE GLUCONATE CLOTH 2 % EX PADS: 6.0000 | MEDICATED_PAD | Freq: Once | CUTANEOUS | Status: DC

## 2017-04-15 MED ORDER — GLYCOPYRROLATE 0.2 MG/ML IJ SOLN
INTRAMUSCULAR | Status: DC | PRN
  Administered 2017-04-15: 0.1 mg via INTRAVENOUS

## 2017-04-15 MED ORDER — LACTATED RINGERS IV SOLN
INTRAVENOUS | Status: DC | PRN
  Administered 2017-04-15 (×2): via INTRAVENOUS

## 2017-04-15 MED ORDER — FENTANYL CITRATE (PF) 100 MCG/2ML IJ SOLN
15.0000 ug | INTRAMUSCULAR | Status: DC | PRN
  Administered 2017-04-15 (×2): 15 ug via INTRAVENOUS

## 2017-04-15 MED ORDER — ONDANSETRON HCL 4 MG/2ML IJ SOLN
INTRAMUSCULAR | Status: DC | PRN
  Administered 2017-04-15: 4 mg via INTRAVENOUS

## 2017-04-15 MED ORDER — FENTANYL CITRATE (PF) 100 MCG/2ML IJ SOLN
INTRAMUSCULAR | Status: DC | PRN
  Administered 2017-04-15 (×2): 50 ug via INTRAVENOUS

## 2017-04-15 MED ORDER — 0.9 % SODIUM CHLORIDE (POUR BTL) OPTIME
TOPICAL | Status: DC | PRN
  Administered 2017-04-15: 100 mL

## 2017-04-15 MED ORDER — LIDOCAINE HCL (CARDIAC) 20 MG/ML IV SOLN
INTRAVENOUS | Status: DC | PRN
  Administered 2017-04-15: 30 mg via INTRAVENOUS

## 2017-04-15 MED ORDER — DEXAMETHASONE SODIUM PHOSPHATE 4 MG/ML IJ SOLN
INTRAMUSCULAR | Status: DC | PRN
  Administered 2017-04-15: 10 mg via INTRAVENOUS

## 2017-04-15 MED ORDER — CEFAZOLIN SODIUM-DEXTROSE 2-4 GM/100ML-% IV SOLN
2.0000 g | INTRAVENOUS | Status: AC
  Administered 2017-04-15: 2 g via INTRAVENOUS

## 2017-04-15 MED ORDER — ONDANSETRON HCL 4 MG/2ML IJ SOLN
INTRAMUSCULAR | Status: AC
  Filled 2017-04-15: qty 2

## 2017-04-15 MED ORDER — FENTANYL CITRATE (PF) 100 MCG/2ML IJ SOLN: 25.0000 ug | INTRAMUSCULAR | Status: DC | PRN

## 2017-04-15 MED ORDER — MIDAZOLAM HCL 2 MG/2ML IJ SOLN
INTRAMUSCULAR | Status: AC
  Filled 2017-04-15: qty 2

## 2017-04-15 MED ORDER — DEXAMETHASONE SODIUM PHOSPHATE 10 MG/ML IJ SOLN
INTRAMUSCULAR | Status: AC
  Filled 2017-04-15: qty 1

## 2017-04-15 MED ORDER — ACETAMINOPHEN 500 MG PO TABS
1000.0000 mg | ORAL_TABLET | ORAL | Status: AC
  Administered 2017-04-15: 1000 mg via ORAL

## 2017-04-15 MED ORDER — BUPIVACAINE-EPINEPHRINE (PF) 0.25% -1:200000 IJ SOLN
INTRAMUSCULAR | Status: DC | PRN
  Administered 2017-04-15: 25 mL via PERINEURAL

## 2017-04-15 MED ORDER — MEPERIDINE HCL 25 MG/ML IJ SOLN: 6.2500 mg | INTRAMUSCULAR | Status: DC | PRN

## 2017-04-15 MED ORDER — ONDANSETRON HCL 4 MG/2ML IJ SOLN: 4.0000 mg | Freq: Once | INTRAMUSCULAR | Status: DC | PRN

## 2017-04-15 MED ORDER — FENTANYL CITRATE (PF) 100 MCG/2ML IJ SOLN
50.0000 ug | Freq: Once | INTRAMUSCULAR | Status: AC
  Administered 2017-04-15: 50 ug via INTRAVENOUS

## 2017-04-15 MED ORDER — ACETAMINOPHEN 500 MG PO TABS
ORAL_TABLET | ORAL | Status: AC
  Filled 2017-04-15: qty 2

## 2017-04-15 SURGICAL SUPPLY — 52 items
ADH SKN CLS APL DERMABOND .7 (GAUZE/BANDAGES/DRESSINGS) ×1
APPLIER CLIP 9.375 MED OPEN (MISCELLANEOUS) ×3
APR CLP MED 9.3 20 MLT OPN (MISCELLANEOUS) ×1
BINDER BREAST LRG (GAUZE/BANDAGES/DRESSINGS) IMPLANT
BINDER BREAST MEDIUM (GAUZE/BANDAGES/DRESSINGS) IMPLANT
BINDER BREAST XLRG (GAUZE/BANDAGES/DRESSINGS) ×2 IMPLANT
BINDER BREAST XXLRG (GAUZE/BANDAGES/DRESSINGS) IMPLANT
BLADE SURG 15 STRL LF DISP TIS (BLADE) ×1 IMPLANT
BLADE SURG 15 STRL SS (BLADE) ×3
CANISTER SUC SOCK COL 7IN (MISCELLANEOUS) IMPLANT
CANISTER SUCT 1200ML W/VALVE (MISCELLANEOUS) ×2 IMPLANT
CHLORAPREP W/TINT 26ML (MISCELLANEOUS) ×3 IMPLANT
CLIP APPLIE 9.375 MED OPEN (MISCELLANEOUS) ×1 IMPLANT
COVER BACK TABLE 60X90IN (DRAPES) ×3 IMPLANT
COVER MAYO STAND STRL (DRAPES) ×3 IMPLANT
COVER PROBE W GEL 5X96 (DRAPES) ×3 IMPLANT
DECANTER SPIKE VIAL GLASS SM (MISCELLANEOUS) IMPLANT
DERMABOND ADVANCED (GAUZE/BANDAGES/DRESSINGS) ×2
DERMABOND ADVANCED .7 DNX12 (GAUZE/BANDAGES/DRESSINGS) ×1 IMPLANT
DEVICE DUBIN W/COMP PLATE 8390 (MISCELLANEOUS) ×3 IMPLANT
DRAPE LAPAROSCOPIC ABDOMINAL (DRAPES) IMPLANT
DRAPE LAPAROTOMY 100X72 PEDS (DRAPES) ×3 IMPLANT
DRAPE UTILITY XL STRL (DRAPES) ×3 IMPLANT
ELECT COATED BLADE 2.86 ST (ELECTRODE) ×3 IMPLANT
ELECT REM PT RETURN 9FT ADLT (ELECTROSURGICAL) ×3
ELECTRODE REM PT RTRN 9FT ADLT (ELECTROSURGICAL) ×1 IMPLANT
GLOVE BIO SURGEON STRL SZ7 (GLOVE) ×3 IMPLANT
GLOVE BIOGEL PI IND STRL 7.0 (GLOVE) IMPLANT
GLOVE BIOGEL PI IND STRL 8 (GLOVE) ×1 IMPLANT
GLOVE BIOGEL PI INDICATOR 7.0 (GLOVE) ×2
GLOVE BIOGEL PI INDICATOR 8 (GLOVE) ×2
GLOVE ECLIPSE 8.0 STRL XLNG CF (GLOVE) ×3 IMPLANT
GOWN STRL REUS W/ TWL LRG LVL3 (GOWN DISPOSABLE) ×2 IMPLANT
GOWN STRL REUS W/TWL LRG LVL3 (GOWN DISPOSABLE) ×6
HEMOSTAT ARISTA ABSORB 3G PWDR (MISCELLANEOUS) IMPLANT
HEMOSTAT SNOW SURGICEL 2X4 (HEMOSTASIS) IMPLANT
KIT MARKER MARGIN INK (KITS) ×3 IMPLANT
NEEDLE HYPO 25X1 1.5 SAFETY (NEEDLE) ×3 IMPLANT
NS IRRIG 1000ML POUR BTL (IV SOLUTION) ×3 IMPLANT
PACK BASIN DAY SURGERY FS (CUSTOM PROCEDURE TRAY) ×3 IMPLANT
PENCIL BUTTON HOLSTER BLD 10FT (ELECTRODE) ×3 IMPLANT
SLEEVE SCD COMPRESS KNEE MED (MISCELLANEOUS) ×3 IMPLANT
SPONGE LAP 4X18 X RAY DECT (DISPOSABLE) ×3 IMPLANT
SUT MNCRL AB 4-0 PS2 18 (SUTURE) ×3 IMPLANT
SUT SILK 2 0 SH (SUTURE) IMPLANT
SUT VICRYL 3-0 CR8 SH (SUTURE) ×3 IMPLANT
SYR CONTROL 10ML LL (SYRINGE) ×3 IMPLANT
TOWEL OR 17X24 6PK STRL BLUE (TOWEL DISPOSABLE) ×3 IMPLANT
TOWEL OR NON WOVEN STRL DISP B (DISPOSABLE) ×3 IMPLANT
TUBE CONNECTING 20'X1/4 (TUBING) ×1
TUBE CONNECTING 20X1/4 (TUBING) ×1 IMPLANT
YANKAUER SUCT BULB TIP NO VENT (SUCTIONS) ×3 IMPLANT

## 2017-04-15 NOTE — Anesthesia Postprocedure Evaluation (Signed)
Anesthesia Post Note  Patient: [name]  Procedure(s) Performed: RIGHT BREAST LUMPECTOMY WITH RADIOACTIVE SEED LOCALIZATION (Right Breast)     Patient location during evaluation: PACU Anesthesia Type: General Level of consciousness: awake and alert Pain management: pain level controlled Vital Signs Assessment: post-procedure vital signs reviewed and stable Respiratory status: spontaneous breathing, nonlabored ventilation, respiratory function stable and patient connected to nasal cannula oxygen Cardiovascular status: blood pressure returned to baseline and stable Postop Assessment: no apparent nausea or vomiting Anesthetic complications: no    Last Vitals:  Vitals:   04/15/17 1300 04/15/17 1343  BP: (!) 159/80 (!) 170/63  Pulse: 67 69  Resp: 11 16  Temp:  36.4 C  SpO2: 97% 94%    Last Pain:  Vitals:   04/15/17 1343  TempSrc:   PainSc: 0-No pain                 Raygen Dahm EDWARD

## 2017-04-15 NOTE — Anesthesia Procedure Notes (Signed)
Procedure Name: LMA Insertion Date/Time: 04/15/2017 10:21 AM Performed by: Marrianne Mood, CRNA Pre-anesthesia Checklist: Patient identified, Emergency Drugs available, Suction available, Patient being monitored and Timeout performed Patient Re-evaluated:Patient Re-evaluated prior to induction Oxygen Delivery Method: Circle system utilized Preoxygenation: Pre-oxygenation with 100% oxygen Induction Type: IV induction Ventilation: Mask ventilation without difficulty LMA: LMA inserted LMA Size: 4.0 Number of attempts: 1 Airway Equipment and Method: Bite block Placement Confirmation: positive ETCO2 Tube secured with: Tape Dental Injury: Teeth and Oropharynx as per pre-operative assessment

## 2017-04-15 NOTE — Discharge Instructions (Signed)
[facility] °Office Phone Number [phone] ° °BREAST BIOPSY/ LUMPECTOMY: POST OP INSTRUCTIONS ° °Always review your discharge instruction sheet given to you by the facility where your surgery was performed. ° °IF YOU HAVE DISABILITY OR FAMILY LEAVE FORMS, YOU MUST BRING THEM TO THE OFFICE FOR PROCESSING.  DO NOT GIVE THEM TO YOUR DOCTOR. ° °1. A prescription for pain medication may be given to you upon discharge.  Take your pain medication as prescribed, if needed.  If narcotic pain medicine is not needed, then you may take acetaminophen (Tylenol) or ibuprofen (Advil) as needed. °2. Take your usually prescribed medications unless otherwise directed °3. If you need a refill on your pain medication, please contact your pharmacy.  They will contact our office to request authorization.  Prescriptions will not be filled after 5pm or on week-ends. °4. You should eat very light the first 24 hours after surgery, such as soup, crackers, pudding, etc.  Resume your normal diet the day after surgery. °5. Most patients will experience some swelling and bruising in the breast.  Ice packs and a good support bra will help.  Swelling and bruising can take several days to resolve.  °6. It is common to experience some constipation if taking pain medication after surgery.  Increasing fluid intake and taking a stool softener will usually help or prevent this problem from occurring.  A mild laxative (Milk of Magnesia or Miralax) should be taken according to package directions if there are no bowel movements after 48 hours. °7. Unless discharge instructions indicate otherwise, you may remove your bandages 24-48 hours after surgery, and you may shower at that time.  You may have steri-strips (small skin tapes) in place directly over the incision.  These strips should be left on the skin for 7-10 days.  If your surgeon used skin glue on the incision, you may shower in 24 hours.  The glue will flake off over the next 2-3  weeks.  Any sutures or staples will be removed at the office during your follow-up visit. °8. ACTIVITIES:  You may resume regular daily activities (gradually increasing) beginning the next day.  Wearing a good support bra or sports bra minimizes pain and swelling.  You may have sexual intercourse when it is comfortable. °a. You may drive when you no longer are taking prescription pain medication, you can comfortably wear a seatbelt, and you can safely maneuver your car and apply brakes. °b. RETURN TO WORK:  ______________________________________________________________________________________ °9. You should see your doctor in the office for a follow-up appointment approximately two weeks after your surgery.  Your doctor’s nurse will typically make your follow-up appointment when she calls you with your pathology report.  Expect your pathology report 2-3 business days after your surgery.  You may call to check if you do not hear from us after three days. °10. OTHER INSTRUCTIONS: _______________________________________________________________________________________________ _____________________________________________________________________________________________________________________________________ °_____________________________________________________________________________________________________________________________________ °_____________________________________________________________________________________________________________________________________ ° °WHEN TO CALL YOUR DOCTOR: °1. Fever over 101.0 °2. Nausea and/or vomiting. °3. Extreme swelling or bruising. °4. Continued bleeding from incision. °5. Increased pain, redness, or drainage from the incision. ° °The clinic staff is available to answer your questions during regular business hours.  Please don’t hesitate to call and ask to speak to one of the nurses for clinical concerns.  If you have a medical emergency, go to the nearest emergency  room or call 911.  A surgeon from [facility] is always on call at the hospital. ° °For further questions, please visit [email or web address]  ° °  Post Anesthesia Home Care Instructions ° °Activity: °Get plenty of rest for the remainder of the day. A responsible individual must stay with you for 24 hours following the procedure.  °For the next 24 hours, DO NOT: °-Drive a car °-Operate machinery °-Drink alcoholic beverages °-Take any medication unless instructed by your physician °-Make any legal decisions or sign important papers. ° °Meals: °Start with liquid foods such as gelatin or soup. Progress to regular foods as tolerated. Avoid greasy, spicy, heavy foods. If nausea and/or vomiting occur, drink only clear liquids until the nausea and/or vomiting subsides. Call your physician if vomiting continues. ° °Special Instructions/Symptoms: °Your throat may feel dry or sore from the anesthesia or the breathing tube placed in your throat during surgery. If this causes discomfort, gargle with warm salt water. The discomfort should disappear within 24 hours. ° °If you had a scopolamine patch placed behind your ear for the management of post- operative nausea and/or vomiting: ° °1. The medication in the patch is effective for 72 hours, after which it should be removed.  Wrap patch in a tissue and discard in the trash. Wash hands thoroughly with soap and water. °2. You may remove the patch earlier than 72 hours if you experience unpleasant side effects which may include dry mouth, dizziness or visual disturbances. °3. Avoid touching the patch. Wash your hands with soap and water after contact with the patch. °  ° ° °

## 2017-04-15 NOTE — Anesthesia Preprocedure Evaluation (Addendum)
Anesthesia Evaluation  Patient identified by MRN, date of birth, ID band Patient awake    Reviewed: Allergy & Precautions, H&P , Patient's Chart, lab work & pertinent test results, reviewed documented beta blocker date and time   Airway Mallampati: II  TM Distance: >3 FB Neck ROM: full    Dental no notable dental hx.    Pulmonary    Pulmonary exam normal breath sounds clear to auscultation       Cardiovascular hypertension, Atrial Fibrillation  Rhythm:regular Rate:Normal     Neuro/Psych    GI/Hepatic   Endo/Other    Renal/GU      Musculoskeletal   Abdominal   Peds  Hematology   Anesthesia Other Findings Good EF Recent pneumonia; feels well now. Chest clear  Reproductive/Obstetrics                            Anesthesia Physical Anesthesia Plan  ASA: II  Anesthesia Plan: General   Post-op Pain Management:  Regional for Post-op pain   Induction: Intravenous  PONV Risk Score and Plan:   Airway Management Planned: LMA  Additional Equipment:   Intra-op Plan:   Post-operative Plan:   Informed Consent: I have reviewed the patients History and Physical, chart, labs and discussed the procedure including the risks, benefits and alternatives for the proposed anesthesia with the patient or authorized representative who has indicated his/her understanding and acceptance.   Dental Advisory Given  Plan Discussed with: CRNA and Surgeon  Anesthesia Plan Comments: ( )        Anesthesia Quick Evaluation

## 2017-04-15 NOTE — Op Note (Signed)
Preoperative diagnosis: Stage I right breast cancer lower inner quadrant  Postoperative diagnosis: Same  Procedure: Right breast seed localized partial mastectomy  Surgeon: Erroll Luna MD  Anesthesia: Pectoral block with general and local  EBL: 20 cc  Specimen: Right breast tissue with seed and clip plus additional superior, inferior, medial, and lateral margins.  The additional anterior and posterior margins would be skin and pectoralis muscle respectively.  Drains: None  IV fluids: Per anesthesia record  Indications for procedure: The patient is an 84 year old female with a stage I right breast cancer.  We discussed options of medical management versus surgical management.  We discussed the pros and cons of lumpectomy and potential other treatments of radiation therapy and chemotherapy.  This was a very small stage I low-grade hormone receptor positive lobular carcinoma of the right breast.  She opted for lumpectomy and possible antiestrogen therapy postoperatively.The procedure has been discussed with the patient. Alternatives to surgery have been discussed with the patient.  Risks of surgery include bleeding,  Infection,  Seroma formation, death,  and the need for further surgery.   The patient understands and wishes to proceed.  Description of procedure: The patient was met in the holding area.  Questions were answered in the right side was marked as the correct side.  The patient underwent seed placement as an outpatient.  She underwent pectoral block by anesthesia.  She was taken back to the operating room and placed supine on the operating room table.  After induction of general anesthesia the right breast was prepped and draped in sterile fashion.  Timeout was done.  She received appropriate preoperative antibiotics.  The neoprobe was used and the seed was identified in the right breast central to lower inner quadrant.  A transverse incision was made over the signal.  All tissue  around the seed and clip were excised.  Generous margins were taken.  Additional shave margins were taken of the medial, lateral, superior, and inferior margins.  The deep and superficial margins were pectoralis major muscle and skin respect the cavity was clipped.  Hemostasis achieved with clips and cautery.  The cavity was closed with 3-0 Vicryl for deep layer and 4-0 Monocryl in a sub-subcuticular stitch.  Dermabond applied to the skin.  All final counts found to be correct.  Breast binder placed.  The patient awoke extubated taken to recovery in satisfactory condition.

## 2017-04-15 NOTE — Interval H&P Note (Signed)
History and Physical Interval Note:  04/15/2017 9:57 AM  [name]  has presented today for surgery, with the diagnosis of right breast cancer  The various methods of treatment have been discussed with the patient and family. After consideration of risks, benefits and other options for treatment, the patient has consented to  Procedure(s) with comments: RIGHT BREAST LUMPECTOMY WITH RADIOACTIVE SEED LOCALIZATION ERAS PATHWAY (Right) - PECTORAL BLOCK as a surgical intervention .  The patient's history has been reviewed, patient examined, no change in status, stable for surgery.  I have reviewed the patient's chart and labs.  Questions were answered to the patient's satisfaction.     [place]

## 2017-04-15 NOTE — Anesthesia Procedure Notes (Signed)
Anesthesia Regional Block: Pectoralis block   Pre-Anesthetic Checklist: ,, timeout performed, Correct Patient, Correct Site, Correct Laterality, Correct Procedure, Correct Position, site marked, Risks and benefits discussed, pre-op evaluation,  At surgeon's request and post-op pain management  Laterality: Right  Prep: chloraprep        Procedures:,,,, ultrasound used (permanent image in chart),,,,  Narrative:  Start time: 04/15/2017 9:31 AM End time: 04/15/2017 9:38 AM Injection made incrementally with aspirations every 5 mL. Anesthesiologist: Lyndle Herrlich, MD

## 2017-04-15 NOTE — Progress Notes (Signed)
Assisted Dr. Kyle Jackson with right, ultrasound guided, pectoralis block. Side rails up, monitors on throughout procedure. See vital signs in flow sheet. Tolerated Procedure well. 

## 2017-04-15 NOTE — Transfer of Care (Signed)
Immediate Anesthesia Transfer of Care Note  Patient: [name]  Procedure(s) Performed: RIGHT BREAST LUMPECTOMY WITH RADIOACTIVE SEED LOCALIZATION (Right Breast)  Patient Location: PACU  Anesthesia Type:GA combined with regional for post-op pain  Level of Consciousness: awake and patient cooperative  Airway & Oxygen Therapy: Patient Spontanous Breathing and Patient connected to face mask oxygen  Post-op Assessment: Report given to RN and Post -op Vital signs reviewed and stable  Post vital signs: Reviewed and stable  Last Vitals:  Vitals:   04/15/17 0939 04/15/17 0944  BP:    Pulse: 67 65  Resp: 17 18  Temp:    SpO2: 98% 97%    Last Pain:  Vitals:   04/15/17 0843  TempSrc: Oral  PainSc: 2       Patients Stated Pain Goal: 2 (30/94/07 6808)  Complications: No apparent anesthesia complications

## 2017-04-15 NOTE — Anesthesia Procedure Notes (Deleted)
Performed by: Marrianne Mood, CRNA

## 2017-04-17 ENCOUNTER — Encounter (HOSPITAL_BASED_OUTPATIENT_CLINIC_OR_DEPARTMENT_OTHER): Payer: Self-pay | Admitting: Surgery

## 2017-04-21 DIAGNOSIS — Z170 Estrogen receptor positive status [ER+]: Secondary | ICD-10-CM | POA: Diagnosis not present

## 2017-04-21 DIAGNOSIS — C50011 Malignant neoplasm of nipple and areola, right female breast: Secondary | ICD-10-CM | POA: Diagnosis not present

## 2017-05-27 DIAGNOSIS — C50011 Malignant neoplasm of nipple and areola, right female breast: Secondary | ICD-10-CM | POA: Diagnosis not present

## 2017-05-27 DIAGNOSIS — E876 Hypokalemia: Secondary | ICD-10-CM | POA: Diagnosis not present

## 2017-05-27 DIAGNOSIS — R42 Dizziness and giddiness: Secondary | ICD-10-CM | POA: Diagnosis not present

## 2017-05-27 DIAGNOSIS — Z79811 Long term (current) use of aromatase inhibitors: Secondary | ICD-10-CM | POA: Diagnosis not present

## 2017-05-27 DIAGNOSIS — Z170 Estrogen receptor positive status [ER+]: Secondary | ICD-10-CM | POA: Diagnosis not present

## 2017-05-27 DIAGNOSIS — Z809 Family history of malignant neoplasm, unspecified: Secondary | ICD-10-CM | POA: Diagnosis not present

## 2017-06-20 DIAGNOSIS — C50311 Malignant neoplasm of lower-inner quadrant of right female breast: Secondary | ICD-10-CM | POA: Insufficient documentation

## 2017-06-20 DIAGNOSIS — Z170 Estrogen receptor positive status [ER+]: Secondary | ICD-10-CM | POA: Insufficient documentation

## 2017-06-20 DIAGNOSIS — N183 Chronic kidney disease, stage 3 unspecified: Secondary | ICD-10-CM | POA: Insufficient documentation

## 2017-06-20 HISTORY — DX: Malignant neoplasm of lower-inner quadrant of right female breast: Z17.0

## 2017-08-25 DIAGNOSIS — Z79811 Long term (current) use of aromatase inhibitors: Secondary | ICD-10-CM | POA: Diagnosis not present

## 2017-08-25 DIAGNOSIS — C50011 Malignant neoplasm of nipple and areola, right female breast: Secondary | ICD-10-CM | POA: Diagnosis not present

## 2017-08-25 DIAGNOSIS — Z170 Estrogen receptor positive status [ER+]: Secondary | ICD-10-CM | POA: Diagnosis not present

## 2017-08-25 DIAGNOSIS — Z809 Family history of malignant neoplasm, unspecified: Secondary | ICD-10-CM | POA: Diagnosis not present

## 2017-12-02 ENCOUNTER — Ambulatory Visit: Payer: Medicare HMO | Admitting: Cardiology

## 2017-12-03 NOTE — Progress Notes (Signed)
Cardiology Office Note:    Date:  12/04/2017   ID:  [name], DOB 29-Sep-1926, MRN [id number]  PCP:  Karlene Einstein, MD  Cardiologist:  Shirlee More, MD    Referring MD: Karlene Einstein, MD    ASSESSMENT:    1. Paroxysmal atrial fibrillation (HCC)   2. On amiodarone therapy   3. Benign hypertensive heart disease without congestive heart failure   4. Acquired hypothyroidism   5. Hyperlipidemia, unspecified hyperlipidemia type    PLAN:    In order of problems listed above:  1. Worsen she is in atrial fibrillation today I will not anticoagulate with her gait dysfunction previous subdural hematoma on anticoagulants apply a 14-day monitor to find this Paroxysmal persistent increase amiodarone check level.  I will see back in 6 weeks to review 2. Increase amiodarone check TSH and liver 3. Change the more predictable dependable diuretic and stop sodium load 4. Stable managed by her PCP 5. Continue her statin   Next appointment: 6 weeks   Medication Adjustments/Labs and Tests Ordered: Current medicines are reviewed at length with the patient today.  Concerns regarding medicines are outlined above.  No orders of the defined types were placed in this encounter.  No orders of the defined types were placed in this encounter.   Chief Complaint  Patient presents with  . Follow-up    History of Present Illness:    [name] is a 84 y.o. female with a hx of hypertensive heart disease without heart failure persistent atrial fibrillation hypothyroidism she is on amiodarone and is not anticoagulated.  She was last seen by Dr. Wynonia Lawman 06/02/2017.  At that time she had a lumpectomy and there is no intention for further radiation or chemotherapy she complained of gait dysfunction and has a background history of a subdural hematoma.  Her most recent lipid profile in the chart had a cholesterol of 163 HDL 57 LDL of 73.  She remained in sinus rhythm was continued on amiodarone and  because of her previous subdural hematoma gait dysfunction risk of fall she was not anticoagulated and was continued on low-dose aspirin.. Compliance with diet, lifestyle and medications: No she is having pain, and salt to her diet  Unfortunately she does not feel well her legs continue to be swollen despite taking a loop diuretic and she particularly is bothered by inability to sleep and leg pain at night and I wonder if she has restless leg syndrome she has no orthopnea dyspnea chest pain palpitation or syncope and was unaware that she is in rate controlled atrial relation today.  For further evaluation 14-day ZIO monitor to see if this is persistent or paroxysmal check and amiodarone level and to alleviate her peripheral edema transition from furosemide to torsemide and asked her to drop dietary salt also check a proBNP level to guide whether this is heart failure in the setting of hypertension atrial fibrillation or more related to her calcium channel blocker amlodipine. Past Medical History:  Diagnosis Date  . A-fib ([place])   . Anxiety   . Cancer [facility]) 2019   right breast  . Dementia ([place])   . Fibromyalgia   . Hypertension   . Hypothyroidism   . Peripheral edema     Past Surgical History:  Procedure Laterality Date  . ABDOMINAL HYSTERECTOMY    . APPENDECTOMY    . BLADDER SUSPENSION    . BREAST LUMPECTOMY WITH RADIOACTIVE SEED LOCALIZATION Right 04/15/2017   Procedure: RIGHT BREAST LUMPECTOMY  WITH RADIOACTIVE SEED LOCALIZATION;  Surgeon: Erroll Luna, MD;  Location: [place];  Service: General;  Laterality: Right;  PECTORAL BLOCK; ERAS PATHWAY  . CARPAL TUNNEL RELEASE    . CHOLECYSTECTOMY    . JOINT REPLACEMENT Left    TKR  . KNEE ARTHROSCOPY Right     Current Medications: Current Meds  Medication Sig  . acetaminophen (TYLENOL) 500 MG tablet Take 500 mg by mouth every 6 (six) hours as needed.  Marland Kitchen amiodarone (PACERONE) 200 MG tablet   . amLODipine (NORVASC) 10  MG tablet Take 5 mg by mouth.   . Ascorbic Acid (VITAMIN C) 1000 MG tablet Take 1,000 mg by mouth.  Marland Kitchen aspirin EC 81 MG tablet   . Calcium Carb-Cholecalciferol (CALCIUM-VITAMIN D) 500-200 MG-UNIT tablet Take by mouth.  . donepezil (ARICEPT) 10 MG tablet TAKE 1 TABLET EVERY NIGHT  . furosemide (LASIX) 40 MG tablet TAKE 1 TABLET BY MOUTH  TWICE A DAY  . Horse Chestnut 300 MG CAPS Take by mouth.  Marland Kitchen HYDROcodone-acetaminophen (NORCO/VICODIN) 5-325 MG tablet Take 1 tablet every 8 hours as needed for severe low back pain  . levothyroxine (SYNTHROID, LEVOTHROID) 125 MCG tablet TAKE 1 TABLET DAILY AS DIRECTED  . lisinopril (PRINIVIL,ZESTRIL) 40 MG tablet   . LORazepam (ATIVAN) 2 MG tablet TAKE 1 AND 1/2 TABLETS AT BEDTIME  . memantine (NAMENDA) 10 MG tablet Take 1 tab BID. Must be seen prior to future refills.  . Misc. Devices (WALKER) MISC Use rolling walker as directed  . oxybutynin (DITROPAN) 5 MG tablet   . polyethylene glycol powder (GLYCOLAX/MIRALAX) powder Take 17 g by mouth.  . Prenatal Multivit-Min-Fe-FA (PRENATAL VITAMINS PO) Take by mouth.  . sertraline (ZOLOFT) 50 MG tablet Take 50 mg by mouth.  . simvastatin (ZOCOR) 20 MG tablet   . Turmeric 450 MG CAPS Take by mouth.  . zinc gluconate 50 MG tablet Take 50 mg by mouth daily.     Allergies:   Patient has no known allergies.   Social History   Socioeconomic History  . Marital status: Married    Spouse name: Not on file  . Number of children: Not on file  . Years of education: Not on file  . Highest education level: Not on file  Occupational History  . Not on file  Social Needs  . Financial resource strain: Not on file  . Food insecurity:    Worry: Not on file    Inability: Not on file  . Transportation needs:    Medical: Not on file    Non-medical: Not on file  Tobacco Use  . Smoking status: Never Smoker  . Smokeless tobacco: Never Used  Substance and Sexual Activity  . Alcohol use: No    Frequency: Never  . Drug use:  No  . Sexual activity: Not on file  Lifestyle  . Physical activity:    Days per week: Not on file    Minutes per session: Not on file  . Stress: Not on file  Relationships  . Social connections:    Talks on phone: Not on file    Gets together: Not on file    Attends religious service: Not on file    Active member of club or organization: Not on file    Attends meetings of clubs or organizations: Not on file    Relationship status: Not on file  Other Topics Concern  . Not on file  Social History Narrative  . Not on file  Family History: The patient's family history includes Breast cancer in her sister; CAD in her brother; CVA in her father; Cancer in her brother; Diabetes in her brother, mother, and sister; Heart attack in her mother; Hypertension in her father, sister, and sister. ROS:   Please see the history of present illness.    All other systems reviewed and are negative.  EKGs/Labs/Other Studies Reviewed:    The following studies were reviewed today:  EKG:  EKG ordered today.  The ekg ordered today demonstrates AF controlled rate  Recent Labs: 04/10/2017: BUN 17; Creatinine, Ser 1.09; Potassium 3.5; Sodium 141  Recent Lipid Panel No results found for: CHOL, TRIG, HDL, CHOLHDL, VLDL, LDLCALC, LDLDIRECT  Physical Exam:    VS:  BP (!) 150/70 (BP Location: Right Arm, Patient Position: Sitting, Cuff Size: Normal)   Pulse (!) 59   Ht 5\' 2"  (1.575 m)   Wt 185 lb (83.9 kg)   SpO2 92%   BMI 33.84 kg/m     Wt Readings from Last 3 Encounters:  12/04/17 185 lb (83.9 kg)  04/15/17 184 lb 6.4 oz (83.6 kg)     GEN:  Well nourished, well developed in no acute distress HEENT: Normal NECK: No JVD; No carotid bruits LYMPHATICS: No lymphadenopathy CARDIAC: Irr Irr variable S1, no murmurs, rubs, gallops RESPIRATORY:  Clear to auscultation without rales, wheezing or rhonchi  ABDOMEN: Soft, non-tender, non-distended MUSCULOSKELETAL:  3-4+ doughy edema edema; No deformity    SKIN: Warm and dry NEUROLOGIC:  Alert and oriented x 3 PSYCHIATRIC:  Normal affect    Signed, Shirlee More, MD  12/04/2017 8:58 AM    [place]

## 2017-12-04 ENCOUNTER — Encounter: Payer: Self-pay | Admitting: Cardiology

## 2017-12-04 ENCOUNTER — Ambulatory Visit: Payer: Medicare HMO | Admitting: Cardiology

## 2017-12-04 VITALS — BP 150/70 | HR 59 | Ht 62.0 in | Wt 185.0 lb

## 2017-12-04 DIAGNOSIS — I119 Hypertensive heart disease without heart failure: Secondary | ICD-10-CM

## 2017-12-04 DIAGNOSIS — E785 Hyperlipidemia, unspecified: Secondary | ICD-10-CM

## 2017-12-04 DIAGNOSIS — R0602 Shortness of breath: Secondary | ICD-10-CM

## 2017-12-04 DIAGNOSIS — E039 Hypothyroidism, unspecified: Secondary | ICD-10-CM

## 2017-12-04 DIAGNOSIS — Z79899 Other long term (current) drug therapy: Secondary | ICD-10-CM | POA: Diagnosis not present

## 2017-12-04 DIAGNOSIS — I480 Paroxysmal atrial fibrillation: Secondary | ICD-10-CM | POA: Diagnosis not present

## 2017-12-04 MED ORDER — TORSEMIDE 20 MG PO TABS: 20.0000 mg | ORAL_TABLET | Freq: Two times a day (BID) | ORAL | 1 refills | Status: DC

## 2017-12-04 NOTE — Patient Instructions (Signed)
Medication Instructions:   STOP TAKING FUROSEMIDE  START TAKING TORSEMIDE 20 MG TWO TIMES PER DAY  INCREASE AMIODARONE TO 200 MG TWO TIMES PER DAY  If you need a refill on your cardiac medications before your next appointment, please call your pharmacy.   Lab work:  Your physician recommends that you return for lab work in  Comprehensive Metabolic Panel and Pro BNP, Amiodarone Level  If you have labs (blood work) drawn today and your tests are completely normal, you will receive your results only by: Marland Kitchen MyChart Message (if you have MyChart) OR . A paper copy in the mail If you have any lab test that is abnormal or we need to change your treatment, we will call you to review the results.  Testing/Procedures: Your physician has recommended that you wear a Zio patch. Zio patches are medical devices that record the heart's electrical activity. Doctors most often use these monitors to diagnose arrhythmias. Arrhythmias are problems with the speed or rhythm of the heartbeat. The monitor is a small, portable device. You can wear one while you do your normal daily activities. This is usually used to diagnose what is causing palpitations/syncope (passing out). Wear for 14 days  Follow-Up: At [facility], you and your health needs are our priority.  As part of our continuing mission to provide you with exceptional heart care, we have created designated Provider Care Teams.  These Care Teams include your primary Cardiologist (physician) and Advanced Practice Providers (APPs -  Physician Assistants and Nurse Practitioners) who all work together to provide you with the care you need, when you need it. . Your physician recommends that you schedule a follow-up appointment in: 6 weeks .   Any Other Special Instructions Will Be Listed Below (If Applicable). Pt's daughter was instructed to call us back this afternoon with correct Amiodarone dosage.

## 2017-12-05 LAB — PRO B NATRIURETIC PEPTIDE: NT-Pro BNP: 1224 pg/mL — ABNORMAL HIGH (ref 0–738)

## 2017-12-05 LAB — COMPREHENSIVE METABOLIC PANEL
A/G RATIO: 1.9 (ref 1.2–2.2)
ALBUMIN: 5 g/dL — AB (ref 3.5–4.7)
ALK PHOS: 82 IU/L (ref 39–117)
ALT: 22 IU/L (ref 0–32)
AST: 22 IU/L (ref 0–40)
BILIRUBIN TOTAL: 0.3 mg/dL (ref 0.0–1.2)
BUN / CREAT RATIO: 22 (ref 12–28)
BUN: 29 mg/dL — ABNORMAL HIGH (ref 8–27)
CHLORIDE: 98 mmol/L (ref 96–106)
CO2: 26 mmol/L (ref 20–29)
Calcium: 10.1 mg/dL (ref 8.7–10.3)
Creatinine, Ser: 1.33 mg/dL — ABNORMAL HIGH (ref 0.57–1.00)
GFR calc non Af Amer: 35 mL/min/{1.73_m2} — ABNORMAL LOW (ref >59)
GFR, EST AFRICAN AMERICAN: 41 mL/min/{1.73_m2} — AB (ref >59)
Globulin, Total: 2.7 g/dL (ref 1.5–4.5)
Glucose: 109 mg/dL — ABNORMAL HIGH (ref 65–99)
POTASSIUM: 4.2 mmol/L (ref 3.5–5.2)
Sodium: 142 mmol/L (ref 134–144)
TOTAL PROTEIN: 7.7 g/dL (ref 6.0–8.5)

## 2017-12-08 ENCOUNTER — Ambulatory Visit: Payer: Medicare HMO

## 2017-12-08 DIAGNOSIS — I480 Paroxysmal atrial fibrillation: Secondary | ICD-10-CM | POA: Diagnosis not present

## 2017-12-09 LAB — PRO B NATRIURETIC PEPTIDE: NT-Pro BNP: 1166 pg/mL — ABNORMAL HIGH (ref 0–738)

## 2017-12-09 LAB — AMIODARONE LEVEL
Amiodarone, Serum: 0.4 ug/mL — ABNORMAL LOW (ref 1.0–2.5)
N-DESETHYL-AMIODARONE: 0.3 ug/mL — AB (ref 1.0–2.5)

## 2017-12-29 ENCOUNTER — Telehealth: Payer: Self-pay | Admitting: Cardiology

## 2017-12-29 ENCOUNTER — Telehealth: Payer: Self-pay

## 2017-12-29 MED ORDER — AMLODIPINE BESYLATE 10 MG PO TABS: 5.0000 mg | ORAL_TABLET | Freq: Every day | ORAL | 1 refills | Status: DC

## 2017-12-29 MED ORDER — TORSEMIDE 20 MG PO TABS: 20.0000 mg | ORAL_TABLET | Freq: Two times a day (BID) | ORAL | 1 refills | Status: DC

## 2017-12-29 NOTE — Telephone Encounter (Signed)
Rx sent to pharmacy as requested.

## 2017-12-29 NOTE — Telephone Encounter (Signed)
Refill already completed by Harrel Lemon, CMA.

## 2017-12-29 NOTE — Telephone Encounter (Signed)
° ° ° °  1. Which medications need to be refilled? (please list name of each medication and dose if known) torsemide 20mg  tablet  2. Which pharmacy/location (including street and city if local pharmacy) is medication to be sent to?[facility]  3. Do they need a 30 day or 90 day supply? [place]

## 2018-01-06 ENCOUNTER — Other Ambulatory Visit: Payer: Self-pay

## 2018-01-06 DIAGNOSIS — R42 Dizziness and giddiness: Secondary | ICD-10-CM | POA: Insufficient documentation

## 2018-01-06 HISTORY — DX: Dizziness and giddiness: R42

## 2018-01-06 MED ORDER — TORSEMIDE 20 MG PO TABS: 20.0000 mg | ORAL_TABLET | Freq: Two times a day (BID) | ORAL | 0 refills | Status: DC

## 2018-01-08 ENCOUNTER — Telehealth: Payer: Self-pay | Admitting: *Deleted

## 2018-01-08 DIAGNOSIS — C50011 Malignant neoplasm of nipple and areola, right female breast: Secondary | ICD-10-CM | POA: Diagnosis not present

## 2018-01-08 DIAGNOSIS — Z79811 Long term (current) use of aromatase inhibitors: Secondary | ICD-10-CM | POA: Diagnosis not present

## 2018-01-08 DIAGNOSIS — Z170 Estrogen receptor positive status [ER+]: Secondary | ICD-10-CM | POA: Diagnosis not present

## 2018-01-08 MED ORDER — AMIODARONE HCL 200 MG PO TABS: 200.0000 mg | ORAL_TABLET | Freq: Two times a day (BID) | ORAL | 0 refills | Status: DC

## 2018-01-08 NOTE — Telephone Encounter (Signed)
Patient has been working with [place] for the past few weeks. [name] called today with concern about the patient's heart rate being "more irregular than usual." [name] put the phone on speaker so the patient could talk. Patient confirmed that she has not been taking amiodarone 200 mg twice daily as prescribed and cannot remember the last time she took this medication. Advised patient that with her history of atrial fibrillation, she needs to be taking amiodarone as prescribed. Refilled medication and patient's daughter is going to pick it up now. Patient is scheduled to follow up next week on 01/15/18 with Dr. Bettina Gavia. Advised them to contact our office with further questions or concerns.

## 2018-01-14 ENCOUNTER — Other Ambulatory Visit: Payer: Self-pay

## 2018-01-14 ENCOUNTER — Telehealth: Payer: Self-pay | Admitting: Cardiology

## 2018-01-14 MED ORDER — AMLODIPINE BESYLATE 10 MG PO TABS: 10.0000 mg | ORAL_TABLET | Freq: Every day | ORAL | 1 refills | Status: DC

## 2018-01-14 MED ORDER — AMIODARONE HCL 200 MG PO TABS: 200.0000 mg | ORAL_TABLET | Freq: Two times a day (BID) | ORAL | 0 refills | Status: DC

## 2018-01-14 NOTE — Telephone Encounter (Signed)
Spoke with patient regarding amlodipine. Patient states she has been taking 10 mg daily of this medication but when she received her refill today it instructed her to take 0.5 tablet (5 mg) daily. Advised patient to continue taking the medication as she has been and this correction was made to the patient's medication list.   Returned Elaine's call and she wanted to let me know that during the physical therapy session today the "patient's blood pressure was a little higher than normal at 158/84." Dewayne Hatch that the patient has an appointment to see Dr. Bettina Gavia tomorrow and we would discuss her blood pressure at this time. No further questions.

## 2018-01-14 NOTE — Telephone Encounter (Signed)
Patient's home health aid called regarding elevated BP

## 2018-01-14 NOTE — Progress Notes (Signed)
Cardiology Office Note:    Date:  01/15/2018   ID:  [name], DOB 02/11/1926, MRN [id number]  PCP:  Karlene Einstein, MD  Cardiologist:  Shirlee More, MD    Referring MD: Karlene Einstein, MD    ASSESSMENT:    1. Paroxysmal atrial fibrillation (HCC)   2. Hypertensive heart disease, unspecified whether heart failure present   3. On amiodarone therapy   4. Hyperlipidemia, unspecified hyperlipidemia type    PLAN:    In order of problems listed above:  1.   On her recent event monitor is in persistent atrial fibrillation despite amiodarone noted her level was low at the last visit 2.   Stable she has no evidence of heart failure however continue her current loop diuretic we will recheck renal function today with CKD stage III and potassium. 3.   We will do an office EKG today if she remains in atrial fibrillation will begin to decrease the dose utilizing amiodarone for rate control and slowly withdrawal.  She remains in atrial fibrillation slow rate 50 bpm we will discontinue amiodarone to in office EKG in 2 weeks asked her daughter to purchase a device either oximeter or digital blood pressure cuff and track her heart rates and record 4.   Stable continue statin   Next appointment: 3 months   Medication Adjustments/Labs and Tests Ordered: Current medicines are reviewed at length with the patient today.  Concerns regarding medicines are outlined above.  No orders of the defined types were placed in this encounter.  No orders of the defined types were placed in this encounter.   No chief complaint on file.   History of Present Illness:    [name] is a 84 y.o. female with a hx of hypertensive heart disease without heart failure persistent atrial fibrillation hypothyroidism she is on amiodarone and is not anticoagulated.  She was last seen by Dr. Wynonia Lawman 06/02/2017.  At that time she had a lumpectomy and there is no intention for further radiation or chemotherapy she  complained of gait dysfunction and has a background history of a subdural hematoma.  Her most recent lipid profile in the chart had a cholesterol of 163 HDL 57 LDL of 73.  She remained in sinus rhythm was continued on amiodarone and because of her previous subdural hematoma gait dysfunction risk of fall she was not anticoagulated and was continued on low-dose aspirin 12/04/17 last seen 12/04/2017 Compliance with diet, lifestyle and medications: Yes her daughter supervises medications  She is improved no longer has edema shortness of breath no awareness of her heart beating and is not having chest pain syncope or TIA.  Unfortunately her event monitor showed her to be in persistent atrial fibrillation I continued her on amiodarone check in office EKG today.  Her other labs showed moderate renal insufficiency stage III creatinine 1.33 potassium 4.3 and a proBNP level is elevated. Past Medical History:  Diagnosis Date  . A-fib ([place])   . Anxiety   . Cancer [facility]) 2019   right breast  . Dementia ([place])   . Fibromyalgia   . Hypertension   . Hypothyroidism   . Peripheral edema     Past Surgical History:  Procedure Laterality Date  . ABDOMINAL HYSTERECTOMY    . APPENDECTOMY    . BLADDER SUSPENSION    . BREAST LUMPECTOMY WITH RADIOACTIVE SEED LOCALIZATION Right 04/15/2017   Procedure: RIGHT BREAST LUMPECTOMY WITH RADIOACTIVE SEED LOCALIZATION;  Surgeon: Erroll Luna, MD;  Location:  [place];  Service: General;  Laterality: Right;  PECTORAL BLOCK; ERAS PATHWAY  . CARPAL TUNNEL RELEASE    . CHOLECYSTECTOMY    . JOINT REPLACEMENT Left    TKR  . KNEE ARTHROSCOPY Right     Current Medications: Current Meds  Medication Sig  . acetaminophen (TYLENOL) 500 MG tablet Take 500 mg by mouth every 6 (six) hours as needed.  Marland Kitchen amiodarone (PACERONE) 200 MG tablet Take 1 tablet (200 mg total) by mouth 2 (two) times daily.  Marland Kitchen amLODipine (NORVASC) 10 MG tablet Take 1 tablet (10 mg total) by  mouth daily.  Marland Kitchen anastrozole (ARIMIDEX) 1 MG tablet Take 1 mg by mouth daily.  . Ascorbic Acid (VITAMIN C) 1000 MG tablet Take 1,000 mg by mouth daily.   Marland Kitchen aspirin EC 81 MG tablet   . donepezil (ARICEPT) 10 MG tablet TAKE 1 TABLET EVERY NIGHT  . HYDROcodone-acetaminophen (NORCO/VICODIN) 5-325 MG tablet Take 1 tablet every 8 hours as needed for severe low back pain  . levothyroxine (SYNTHROID, LEVOTHROID) 125 MCG tablet TAKE 1 TABLET DAILY AS DIRECTED  . lisinopril (PRINIVIL,ZESTRIL) 40 MG tablet Take 40 mg by mouth daily.   Marland Kitchen LORazepam (ATIVAN) 2 MG tablet Take 2 mg by mouth at bedtime.   . memantine (NAMENDA) 10 MG tablet Take 1 tab BID. Must be seen prior to future refills.  . Misc. Devices (WALKER) MISC Use rolling walker as directed  . Multiple Vitamins-Minerals (VITAMIN D3 COMPLETE PO) Take 1 tablet by mouth daily.  Marland Kitchen oxybutynin (DITROPAN) 5 MG tablet Take 5 mg by mouth 2 (two) times daily.   . sertraline (ZOLOFT) 50 MG tablet Take 50 mg by mouth daily.   . simvastatin (ZOCOR) 20 MG tablet Take 20 mg by mouth daily.   Marland Kitchen torsemide (DEMADEX) 20 MG tablet Take 1 tablet (20 mg total) by mouth 2 (two) times daily.     Allergies:   Patient has no known allergies.   Social History   Socioeconomic History  . Marital status: Married    Spouse name: Not on file  . Number of children: Not on file  . Years of education: Not on file  . Highest education level: Not on file  Occupational History  . Not on file  Social Needs  . Financial resource strain: Not on file  . Food insecurity:    Worry: Not on file    Inability: Not on file  . Transportation needs:    Medical: Not on file    Non-medical: Not on file  Tobacco Use  . Smoking status: Never Smoker  . Smokeless tobacco: Never Used  Substance and Sexual Activity  . Alcohol use: No    Frequency: Never  . Drug use: No  . Sexual activity: Not on file  Lifestyle  . Physical activity:    Days per week: Not on file    Minutes per  session: Not on file  . Stress: Not on file  Relationships  . Social connections:    Talks on phone: Not on file    Gets together: Not on file    Attends religious service: Not on file    Active member of club or organization: Not on file    Attends meetings of clubs or organizations: Not on file    Relationship status: Not on file  Other Topics Concern  . Not on file  Social History Narrative  . Not on file     Family History: The patient's  family history includes Breast cancer in her sister; CAD in her brother; CVA in her father; Cancer in her brother; Diabetes in her brother, mother, and sister; Heart attack in her mother; Hypertension in her father, sister, and sister. ROS:   Please see the history of present illness.    All other systems reviewed and are negative.  EKGs/Labs/Other Studies Reviewed:    The following studies were reviewed today:  EKG:  EKG ordered today.  The ekg ordered today demonstrates atrial fibrillation controlled rate  Recent Labs: 12/04/2017: ALT 22; BUN 29; Creatinine, Ser 1.33; NT-Pro BNP 1,224; NT-Pro BNP 1,166; Potassium 4.2; Sodium 142  Recent Lipid Panel No results found for: CHOL, TRIG, HDL, CHOLHDL, VLDL, LDLCALC, LDLDIRECT  Physical Exam:    VS:  BP (!) 130/56 (BP Location: Right Arm, Patient Position: Sitting, Cuff Size: Large)   Pulse 60   Ht 5\' 2"  (1.575 m)   Wt 178 lb (80.7 kg)   SpO2 93%   BMI 32.56 kg/m     Wt Readings from Last 3 Encounters:  01/15/18 178 lb (80.7 kg)  12/04/17 185 lb (83.9 kg)  04/15/17 184 lb 6.4 oz (83.6 kg)     GEN: Frail Well nourished, well developed in no acute distress HEENT: Normal NECK: No JVD; No carotid bruits LYMPHATICS: No lymphadenopathy CARDIAC: Irr Irr variiable S1 RESPIRATORY:  Clear to auscultation without rales, wheezing or rhonchi  ABDOMEN: Soft, non-tender, non-distended MUSCULOSKELETAL:  No edema; No deformity  SKIN: Warm and dry NEUROLOGIC:  Alert and oriented x  3 PSYCHIATRIC:  Normal affect    Signed, Shirlee More, MD  01/15/2018 11:41 AM    [place]

## 2018-01-15 ENCOUNTER — Ambulatory Visit: Payer: Medicare HMO | Admitting: Cardiology

## 2018-01-15 ENCOUNTER — Encounter: Payer: Self-pay | Admitting: Cardiology

## 2018-01-15 VITALS — BP 130/56 | HR 60 | Ht 62.0 in | Wt 178.0 lb

## 2018-01-15 DIAGNOSIS — Z79899 Other long term (current) drug therapy: Secondary | ICD-10-CM | POA: Diagnosis not present

## 2018-01-15 DIAGNOSIS — I110 Hypertensive heart disease with heart failure: Secondary | ICD-10-CM | POA: Diagnosis not present

## 2018-01-15 DIAGNOSIS — I480 Paroxysmal atrial fibrillation: Secondary | ICD-10-CM | POA: Diagnosis not present

## 2018-01-15 DIAGNOSIS — E785 Hyperlipidemia, unspecified: Secondary | ICD-10-CM | POA: Diagnosis not present

## 2018-01-15 NOTE — Patient Instructions (Addendum)
Medication Instructions:  Your physician has recommended you make the following change in your medication:   STOP amiodarone  If you need a refill on your cardiac medications before your next appointment, please call your pharmacy.   Lab work: None  If you have labs (blood work) drawn today and your tests are completely normal, you will receive your results only by: Marland Kitchen MyChart Message (if you have MyChart) OR . A paper copy in the mail If you have any lab test that is abnormal or we need to change your treatment, we will call you to review the results.  Testing/Procedures: You had an EKG today.   You will be scheduled for a nurse visit in 2 weeks for an EKG.   Follow-Up: At [facility], you and your health needs are our priority.  As part of our continuing mission to provide you with exceptional heart care, we have created designated Provider Care Teams.  These Care Teams include your primary Cardiologist (physician) and Advanced Practice Providers (APPs -  Physician Assistants and Nurse Practitioners) who all work together to provide you with the care you need, when you need it. You will need a follow up appointment in 6 months.  Please call our office 2 months in advance to schedule this appointment.    **Please purchase a blood pressure machine or pulse oximeter to monitor your heart rate daily. Keep a log of these readings and bring to your next appointment.

## 2018-01-27 NOTE — Telephone Encounter (Signed)
Done

## 2018-01-29 ENCOUNTER — Ambulatory Visit (INDEPENDENT_AMBULATORY_CARE_PROVIDER_SITE_OTHER): Payer: Medicare HMO | Admitting: Cardiology

## 2018-01-29 DIAGNOSIS — I480 Paroxysmal atrial fibrillation: Secondary | ICD-10-CM

## 2018-01-29 NOTE — Patient Instructions (Signed)

## 2018-03-23 ENCOUNTER — Telehealth: Payer: Self-pay | Admitting: Cardiology

## 2018-03-23 ENCOUNTER — Other Ambulatory Visit: Payer: Self-pay | Admitting: Cardiology

## 2018-03-23 NOTE — Telephone Encounter (Signed)
Patient reports bilateral lower extremity swelling for the past 2-3 weeks. Patient is taking amlodipine 10 mg daily in addition to all other medications as prescribed. Patient has not been keeping track of her blood pressure or heart rate recently. Advised her to elevate her legs as much as possible. Will have Dr. Bettina Gavia advise.

## 2018-03-23 NOTE — Telephone Encounter (Signed)
Patient informed to stop amlodipine as it is the probably cause of her swelling per Dr. Bettina Gavia. Patient is agreeable and will monitor her blood pressure daily and record each reading. Patient will call by the end of the week to inform us of blood pressure readings. No further questions.

## 2018-03-23 NOTE — Telephone Encounter (Signed)
Stop amlpdipine  Call ina few days with home BP

## 2018-03-27 ENCOUNTER — Telehealth: Payer: Self-pay | Admitting: Cardiology

## 2018-03-27 NOTE — Telephone Encounter (Signed)
Patient called with the following Bps: Monday - 144/58 Tuesday - 146/61 Wednesday - 145/62 pulse 91 Thursday - 126/66 pulse 83 Friday 133/51 pulse 63  Patient states she is off Amlodipine also

## 2018-03-27 NOTE — Telephone Encounter (Signed)
Good result continue to monitor on blood pressures remain off amlodipine let us know if her systolics are persistently greater than 150

## 2018-03-30 MED ORDER — HYDRALAZINE HCL 25 MG PO TABS: 12.5000 mg | ORAL_TABLET | Freq: Three times a day (TID) | ORAL | 3 refills | Status: DC

## 2018-03-30 NOTE — Telephone Encounter (Signed)
Patient informed to remain off amlodipine and start taking hydralazine 12.5 mg three times daily to help better control her blood pressure. Prescription sent to [facility] in [place] as requested. Advised patient to continue to monitor her blood pressure daily at the same time each day and contact our office if her systolic BP remains greater than 150. Patient verbalized understanding. No further questions

## 2018-03-30 NOTE — Telephone Encounter (Signed)
Called patient to informed her of Dr. Joya Gaskins recommendations. However during the call patient reports she took amlodipine today 10 mg after being off of it since last Monday Feb. 17th 2020. She stated she started have a a headache and not feeling well and he blood pressures today were 175/60 and 169/73. The second blood pressure was taken 20 minutes after the amlodipine. I will route to Dr. Bettina Gavia for further instruction and follow up with patient.

## 2018-03-30 NOTE — Telephone Encounter (Signed)
Initiate hydralazine 25 mg tab 1 12:30 and half milligrams 3 times daily dispense 30 days refill 3

## 2018-04-06 DIAGNOSIS — C50011 Malignant neoplasm of nipple and areola, right female breast: Secondary | ICD-10-CM

## 2018-04-06 DIAGNOSIS — Z170 Estrogen receptor positive status [ER+]: Secondary | ICD-10-CM

## 2018-04-10 ENCOUNTER — Telehealth: Payer: Self-pay | Admitting: Cardiology

## 2018-04-10 NOTE — Telephone Encounter (Signed)
Patient's daughter needs a call regarding patient medications.

## 2018-04-10 NOTE — Telephone Encounter (Signed)
I called patient to speak with her about her medications.  I explained to her that we could not speak to [facility] as she is not on designated party release.  Patient advised to update paper work with [facility] name at the next appointment.  Patient informed to remain off amlodipine and start taking hydralazine 12.5 mg three times daily.  Patient advised to continue to monitor blood pressure at home and if systolic blood pressures remain over 150 to contact our office.  Patient agreed to plan and verbalized understanding.

## 2018-04-20 ENCOUNTER — Other Ambulatory Visit: Payer: Self-pay | Admitting: Cardiology

## 2018-05-01 ENCOUNTER — Other Ambulatory Visit: Payer: Self-pay

## 2018-05-01 MED ORDER — HYDRALAZINE HCL 25 MG PO TABS: 12.5000 mg | ORAL_TABLET | Freq: Three times a day (TID) | ORAL | 1 refills | Status: DC

## 2018-05-17 ENCOUNTER — Other Ambulatory Visit: Payer: Self-pay | Admitting: Cardiology

## 2018-06-26 ENCOUNTER — Other Ambulatory Visit: Payer: Self-pay | Admitting: Cardiology

## 2018-06-26 MED ORDER — HYDRALAZINE HCL 25 MG PO TABS: 12.5000 mg | ORAL_TABLET | Freq: Three times a day (TID) | ORAL | 1 refills | Status: DC

## 2018-06-26 NOTE — Telephone Encounter (Signed)
Hydralazine refill sent to [facility] per pt preference

## 2018-06-26 NOTE — Telephone Encounter (Signed)
°*  STAT* If patient is at the pharmacy, call can be transferred to refill team.   1. Which medications need to be refilled? (please list name of each medication and dose if known) hydrALAZINE (APRESOLINE) 25 MG tablet  2. Which pharmacy/location (including street and city if local pharmacy) is medication to be sent to?  [place], [place] [phone] (Phone) [phone] (Fax)     3. Do they need a 30 day or 90 day supply? 90 day

## 2018-06-30 ENCOUNTER — Other Ambulatory Visit: Payer: Self-pay | Admitting: Cardiology

## 2018-06-30 MED ORDER — HYDRALAZINE HCL 25 MG PO TABS: 12.5000 mg | ORAL_TABLET | Freq: Three times a day (TID) | ORAL | 1 refills | Status: DC

## 2018-06-30 NOTE — Telephone Encounter (Signed)
°*  STAT* If patient is at the pharmacy, call can be transferred to refill team.   1. Which medications need to be refilled? (please list name of each medication and dose if known) hydrALAZINE (APRESOLINE) 25 MG tablet  2. Which pharmacy/location (including street and city if local pharmacy) is medication to be sent to?  [place], [place] [phone] (Phone) [phone] (Fax)    3. Do they need a 30 day or 90 day supply? 90 day

## 2018-06-30 NOTE — Telephone Encounter (Signed)
Hydralazine 25 mg tablet (0.5 tablet taken 3 times a day) rx sent to [facility] delivery.

## 2018-07-11 ENCOUNTER — Other Ambulatory Visit: Payer: Self-pay | Admitting: Cardiology

## 2018-07-14 NOTE — Progress Notes (Signed)
Right ear  Date:  07/15/2018   ID:  [name], DOB 1926/11/23, MRN [id number]    PCP:  Karlene Einstein, MD  Cardiologist:  No primary care provider on file. Dr Bettina Gavia Electrophysiologist:  None   Evaluation Performed:  Follow-Up Visit  Chief Complaint: Atrial fibrillation  History of Present Illness:    [name] is a 85 y.o. female with a hx of hypertensive heart disease without heart failure persistent atrial fibrillation hypothyroidism she is on amiodarone and is not anticoagulated.   The patient does not have symptoms concerning for COVID-19 infection (fever, chills, cough, or new shortness of breath).   I brought her to the office because she is on amiodarone however at her last visit she was in persistent atrial fibrillation we withdrew amiodarone and since then she is actually done better her peripheral edema has resolved no shortness of breath palpitations syncope or chest pain she has [birth year] birthday coming up and she just feels tired.  Recent lab work done at her primary care physician's office 06/16/2018 showed a creatinine of 1.12 GFR 44 cc potassium 3.8 and in November TSH was normal cholesterol 146 HDL 58 LDL 77 and CBC was normal at that time. Past Medical History:  Diagnosis Date  . A-fib ([place])   . Anxiety   . Cancer [facility]) 2019   right breast  . Dementia ([place])   . Fibromyalgia   . Hypertension   . Hypothyroidism   . Peripheral edema    Past Surgical History:  Procedure Laterality Date  . ABDOMINAL HYSTERECTOMY    . APPENDECTOMY    . BLADDER SUSPENSION    . BREAST LUMPECTOMY WITH RADIOACTIVE SEED LOCALIZATION Right 04/15/2017   Procedure: RIGHT BREAST LUMPECTOMY WITH RADIOACTIVE SEED LOCALIZATION;  Surgeon: Erroll Luna, MD;  Location: [facility];  Service: General;  Laterality: Right;  PECTORAL BLOCK; ERAS PATHWAY  . CARPAL TUNNEL RELEASE    . CHOLECYSTECTOMY    . JOINT REPLACEMENT Left    TKR  . KNEE ARTHROSCOPY Right       Current Meds  Medication Sig  . acetaminophen (TYLENOL) 500 MG tablet Take 500 mg by mouth every 6 (six) hours as needed.  Marland Kitchen anastrozole (ARIMIDEX) 1 MG tablet Take 1 mg by mouth daily.  . Ascorbic Acid (VITAMIN C) 1000 MG tablet Take 1,000 mg by mouth daily.   Marland Kitchen aspirin EC 81 MG tablet Take 81 mg by mouth daily.  Marland Kitchen donepezil (ARICEPT) 10 MG tablet TAKE 1 TABLET EVERY NIGHT  . esomeprazole (NEXIUM) 40 MG capsule Take 40 mg by mouth daily at 12 noon.  . hydrALAZINE (APRESOLINE) 25 MG tablet TAKE 1/2 TABLETS (12.5 MG TOTAL) BY MOUTH 3 (THREE) TIMES DAILY.  Marland Kitchen HYDROcodone-acetaminophen (NORCO/VICODIN) 5-325 MG tablet Take 1 tablet every 8 hours as needed for severe low back pain  . levothyroxine (SYNTHROID, LEVOTHROID) 125 MCG tablet TAKE 1 TABLET DAILY AS DIRECTED  . lisinopril (PRINIVIL,ZESTRIL) 40 MG tablet Take 40 mg by mouth daily.   . memantine (NAMENDA) 10 MG tablet Take 1 tab BID. Must be seen prior to future refills.  . Misc. Devices (WALKER) MISC Use rolling walker as directed  . Multiple Vitamins-Minerals (VITAMIN D3 COMPLETE PO) Take 1 tablet by mouth daily.  Marland Kitchen oxybutynin (DITROPAN) 5 MG tablet Take 5 mg by mouth 2 (two) times daily.   . sertraline (ZOLOFT) 50 MG tablet Take 50 mg by mouth daily.   . simvastatin (ZOCOR) 20 MG tablet Take 20  mg by mouth daily.   Marland Kitchen torsemide (DEMADEX) 20 MG tablet TAKE 1 TABLET BY MOUTH TWICE A DAY  . traZODone (DESYREL) 50 MG tablet Take 100 mg by mouth at bedtime.     Allergies:   Patient has no known allergies.   Social History   Tobacco Use  . Smoking status: Never Smoker  . Smokeless tobacco: Never Used  Substance Use Topics  . Alcohol use: No    Frequency: Never  . Drug use: No     Family Hx: The patient's family history includes Breast cancer in her sister; CAD in her brother; CVA in her father; Cancer in her brother; Diabetes in her brother, mother, and sister; Heart attack in her mother; Hypertension in her father, sister, and  sister.  ROS:   Please see the history of present illness.     All other systems reviewed and are negative.   Prior CV studies:   The following studies were reviewed today:    Labs/Other Tests and Data Reviewed:    EKG:  An ECG dated 12/23/17 was personally reviewed today and demonstrated:  Rate controlled atrial fibrillation, I did not repeat today as her heart rate is controlled and she is not having symptoms related to atrial fibrillation  Recent Labs: 12/04/2017: ALT 22; BUN 29; Creatinine, Ser 1.33; NT-Pro BNP 1,224; NT-Pro BNP 1,166; Potassium 4.2; Sodium 142   Recent Lipid Panel No results found for: CHOL, TRIG, HDL, CHOLHDL, LDLCALC, LDLDIRECT  Wt Readings from Last 3 Encounters:  07/15/18 173 lb 1.3 oz (78.5 kg)  01/15/18 178 lb (80.7 kg)  12/04/17 185 lb (83.9 kg)     Objective:    Vital Signs:  BP 132/66   Pulse 86   Ht 5\' 2"  (1.575 m)   Wt 173 lb 1.3 oz (78.5 kg)   SpO2 93%   BMI 31.66 kg/m    VITAL SIGNS:  reviewed GEN:  no acute distress EYES:  sclerae anicteric, EOMI - Extraocular Movements Intact RESPIRATORY:  normal respiratory effort, symmetric expansion CARDIOVASCULAR:  no peripheral edema SKIN:  no rash, lesions or ulcers. MUSCULOSKELETAL:  no obvious deformities. NEURO:  alert and oriented x 3, no obvious focal deficit PSYCH:  normal affect  She has no neck vein distention or edema irregular rhythm variable first heart sound no murmur no rales ASSESSMENT & PLAN:    1.  Atrial fibrillation persistent stable rate is controlled off amiodarone continue aspirin for stroke prophylaxis Hypertensive heart disease of heart failure improved continue her diuretic no evidence of fluid overload compensated New York Heart Association class I continue ACE inhibitor and hydralazine for hypertension. Hypothyroidism stable continue her current supplement TSH was normal Hyperlipidemia stable continue statin lipids at target  COVID-19 Education: The signs and  symptoms of COVID-19 were discussed with the patient and how to seek care for testing (follow up with PCP or arrange E-visit).   The importance of social distancing was discussed today.  Time:   Today, I have spent 20 minutes with the patient with telehealth technology discussing the above problems.     Medication Adjustments/Labs and Tests Ordered: Current medicines are reviewed at length with the patient today.  Concerns regarding medicines are outlined above.   Tests Ordered: No orders of the defined types were placed in this encounter.   Medication Changes: No orders of the defined types were placed in this encounter.   Disposition:  Follow up in 6 month(s)  Signed, Shirlee More, MD  07/15/2018 3:41  PM    [facility]

## 2018-07-15 ENCOUNTER — Encounter: Payer: Self-pay | Admitting: Cardiology

## 2018-07-15 ENCOUNTER — Ambulatory Visit (INDEPENDENT_AMBULATORY_CARE_PROVIDER_SITE_OTHER): Payer: Medicare Other | Admitting: Cardiology

## 2018-07-15 ENCOUNTER — Other Ambulatory Visit: Payer: Self-pay

## 2018-07-15 VITALS — BP 132/66 | HR 86 | Ht 62.0 in | Wt 173.1 lb

## 2018-07-15 DIAGNOSIS — I110 Hypertensive heart disease with heart failure: Secondary | ICD-10-CM

## 2018-07-15 DIAGNOSIS — I4819 Other persistent atrial fibrillation: Secondary | ICD-10-CM | POA: Diagnosis not present

## 2018-07-15 DIAGNOSIS — E039 Hypothyroidism, unspecified: Secondary | ICD-10-CM

## 2018-07-15 NOTE — Patient Instructions (Signed)
Medication Instructions:  Your physician recommends that you continue on your current medications as directed. Please refer to the Current Medication list given to you today.  If you need a refill on your cardiac medications before your next appointment, please call your pharmacy.   Lab work: NONE If you have labs (blood work) drawn today and your tests are completely normal, you will receive your results only by: Marland Kitchen MyChart Message (if you have MyChart) OR . A paper copy in the mail If you have any lab test that is abnormal or we need to change your treatment, we will call you to review the results.  Testing/Procedures: NONE  Follow-Up: At [facility], you and your health needs are our priority.  As part of our continuing mission to provide you with exceptional heart care, we have created designated Provider Care Teams.  These Care Teams include your primary Cardiologist (physician) and Advanced Practice Providers (APPs -  Physician Assistants and Nurse Practitioners) who all work together to provide you with the care you need, when you need it. You will need a follow up appointment in 6 months.  Any Other Special Instructions Will Be Listed Below (If Applicable).

## 2018-07-29 ENCOUNTER — Other Ambulatory Visit: Payer: Self-pay | Admitting: *Deleted

## 2018-07-29 ENCOUNTER — Telehealth: Payer: Self-pay | Admitting: Cardiology

## 2018-07-29 MED ORDER — TORSEMIDE 20 MG PO TABS: 20.0000 mg | ORAL_TABLET | Freq: Two times a day (BID) | ORAL | 1 refills | Status: DC

## 2018-07-29 NOTE — Telephone Encounter (Signed)
Demadex refilled

## 2018-07-29 NOTE — Telephone Encounter (Signed)
°*  STAT* If patient is at the pharmacy, call can be transferred to refill team.   1. Which medications need to be refilled? (please list name of each medication and dose if known) torsemide (DEMADEX) 20 MG tablet   2. Which pharmacy/location (including street and city if local pharmacy) is medication to be sent to? [facility]  3. Do they need a 30 day or 90 day supply? 90 day

## 2018-07-31 ENCOUNTER — Other Ambulatory Visit: Payer: Self-pay | Admitting: Cardiology

## 2018-07-31 MED ORDER — HYDRALAZINE HCL 25 MG PO TABS: ORAL_TABLET | ORAL | 1 refills | Status: DC

## 2018-07-31 NOTE — Telephone Encounter (Signed)
Hydralazine refill sent to [facility]

## 2018-07-31 NOTE — Telephone Encounter (Signed)
°*  STAT* If patient is at the pharmacy, call can be transferred to refill team.   1. Which medications need to be refilled? (please list name of each medication and dose if known) Hydralazine 25mg  tablets, take 1/2 tablet three times daily  2. Which pharmacy/location (including street and city if local pharmacy) is medication to be sent to?[facility]  3. Do they need a 30 day or 90 day supply? [place]

## 2018-08-06 ENCOUNTER — Other Ambulatory Visit: Payer: Self-pay | Admitting: Cardiology

## 2018-09-09 ENCOUNTER — Other Ambulatory Visit: Payer: Self-pay | Admitting: *Deleted

## 2018-09-09 MED ORDER — TORSEMIDE 20 MG PO TABS: 20.0000 mg | ORAL_TABLET | Freq: Two times a day (BID) | ORAL | 0 refills | Status: DC

## 2018-09-25 ENCOUNTER — Other Ambulatory Visit: Payer: Self-pay | Admitting: Cardiology

## 2018-10-29 ENCOUNTER — Other Ambulatory Visit: Payer: Self-pay | Admitting: Cardiology

## 2018-11-06 ENCOUNTER — Other Ambulatory Visit: Payer: Self-pay | Admitting: Cardiology

## 2018-11-20 ENCOUNTER — Other Ambulatory Visit: Payer: Self-pay | Admitting: Cardiology

## 2018-11-20 NOTE — Telephone Encounter (Signed)
Rx refill sent to pharmacy. 

## 2018-12-10 ENCOUNTER — Other Ambulatory Visit: Payer: Self-pay

## 2018-12-10 MED ORDER — HYDRALAZINE HCL 25 MG PO TABS: ORAL_TABLET | ORAL | 0 refills | Status: DC

## 2018-12-10 MED ORDER — SIMVASTATIN 20 MG PO TABS: 20.0000 mg | ORAL_TABLET | Freq: Every day | ORAL | 0 refills | Status: AC

## 2019-01-13 ENCOUNTER — Other Ambulatory Visit: Payer: Self-pay | Admitting: Cardiology

## 2019-01-17 NOTE — Progress Notes (Signed)
Cardiology Office Note:    Date:  01/18/2019   ID:  [name], DOB 1926-12-17, MRN [id number]  PCP:  Karlene Einstein, MD  Cardiologist:  Shirlee More, MD    Referring MD: Karlene Einstein, MD    ASSESSMENT:    1. Hypertensive heart disease with heart failure (HCC)   2. Persistent atrial fibrillation ([place])   3. Acquired hypothyroidism    PLAN:    In order of problems listed above:  1. Stable heart failure is compensated continue her current loop diuretic 2. Rate is controlled off amiodarone 3. Check a TSH with her labs today copy to Dr. Sudie Bailey   Next appointment: Months   Medication Adjustments/Labs and Tests Ordered: Current medicines are reviewed at length with the patient today.  Concerns regarding medicines are outlined above.  No orders of the defined types were placed in this encounter.  No orders of the defined types were placed in this encounter.   Chief Complaint  Patient presents with  . Follow-up  . Atrial Fibrillation    History of Present Illness:    [name] is a 85 y.o. female with a hx of hypertensive heart disease with heart failure persistent atrial fibrillation hypothyroidism and amiodarone discontinuation at her last visit who was last seen 07/15/2018. Compliance with diet, lifestyle and medications: Yes  She exhibited no change after stopping her amiodarone well supervised at home has had no palpitation edema shortness of breath chest pain or syncope. Past Medical History:  Diagnosis Date  . A-fib ([place])   . Anxiety   . Cancer [facility]) 2019   right breast  . Dementia ([place])   . Fibromyalgia   . Hypertension   . Hypothyroidism   . Peripheral edema     Past Surgical History:  Procedure Laterality Date  . ABDOMINAL HYSTERECTOMY    . APPENDECTOMY    . BLADDER SUSPENSION    . BREAST LUMPECTOMY WITH RADIOACTIVE SEED LOCALIZATION Right 04/15/2017   Procedure: RIGHT BREAST LUMPECTOMY WITH RADIOACTIVE SEED LOCALIZATION;  Surgeon:  Erroll Luna, MD;  Location: [facility];  Service: General;  Laterality: Right;  PECTORAL BLOCK; ERAS PATHWAY  . CARPAL TUNNEL RELEASE    . CHOLECYSTECTOMY    . JOINT REPLACEMENT Left    TKR  . KNEE ARTHROSCOPY Right     Current Medications: Current Meds  Medication Sig  . acetaminophen (TYLENOL) 500 MG tablet Take 500 mg by mouth every 6 (six) hours as needed.  Marland Kitchen anastrozole (ARIMIDEX) 1 MG tablet Take 1 mg by mouth daily.  . Ascorbic Acid (VITAMIN C) 1000 MG tablet Take 1,000 mg by mouth daily.   Marland Kitchen aspirin EC 81 MG tablet Take 81 mg by mouth daily.  Marland Kitchen donepezil (ARICEPT) 10 MG tablet TAKE 1 TABLET EVERY NIGHT  . esomeprazole (NEXIUM) 40 MG capsule Take 40 mg by mouth daily at 12 noon.  . hydrALAZINE (APRESOLINE) 25 MG tablet TAKE ONE-HALF TABLET BY  MOUTH 3 TIMES DAILY  . HYDROcodone-acetaminophen (NORCO/VICODIN) 5-325 MG tablet Take 1 tablet every 8 hours as needed for severe low back pain  . levothyroxine (SYNTHROID, LEVOTHROID) 125 MCG tablet TAKE 1 TABLET DAILY AS DIRECTED  . lisinopril (PRINIVIL,ZESTRIL) 40 MG tablet Take 40 mg by mouth daily.   . memantine (NAMENDA) 10 MG tablet 10 mg 2 (two) times daily.   . Multiple Vitamins-Minerals (VITAMIN D3 COMPLETE PO) Take 1 tablet by mouth daily.  Marland Kitchen oxybutynin (DITROPAN) 5 MG tablet Take 5 mg by  mouth 2 (two) times daily.   . sertraline (ZOLOFT) 50 MG tablet Take 50 mg by mouth daily.   . simvastatin (ZOCOR) 20 MG tablet Take 1 tablet (20 mg total) by mouth daily.  Marland Kitchen torsemide (DEMADEX) 20 MG tablet TAKE 1 TABLET BY MOUTH  TWICE DAILY  . traZODone (DESYREL) 50 MG tablet Take 100 mg by mouth at bedtime.     Allergies:   Patient has no known allergies.   Social History   Socioeconomic History  . Marital status: Married    Spouse name: Not on file  . Number of children: Not on file  . Years of education: Not on file  . Highest education level: Not on file  Occupational History  . Not on file  Tobacco Use  .  Smoking status: Never Smoker  . Smokeless tobacco: Never Used  Substance and Sexual Activity  . Alcohol use: No  . Drug use: No  . Sexual activity: Not on file  Other Topics Concern  . Not on file  Social History Narrative  . Not on file   Social Determinants of Health   Financial Resource Strain:   . Difficulty of Paying Living Expenses: Not on file  Food Insecurity:   . Worried About Charity fundraiser in the Last Year: Not on file  . Ran Out of Food in the Last Year: Not on file  Transportation Needs:   . Lack of Transportation (Medical): Not on file  . Lack of Transportation (Non-Medical): Not on file  Physical Activity:   . Days of Exercise per Week: Not on file  . Minutes of Exercise per Session: Not on file  Stress:   . Feeling of Stress : Not on file  Social Connections:   . Frequency of Communication with Friends and Family: Not on file  . Frequency of Social Gatherings with Friends and Family: Not on file  . Attends Religious Services: Not on file  . Active Member of Clubs or Organizations: Not on file  . Attends Archivist Meetings: Not on file  . Marital Status: Not on file     Family History: The patient's family history includes Breast cancer in her sister; CAD in her brother; CVA in her father; Cancer in her brother; Diabetes in her brother, mother, and sister; Heart attack in her mother; Hypertension in her father, sister, and sister. ROS:   Please see the history of present illness.    All other systems reviewed and are negative.  EKGs/Labs/Other Studies Reviewed:    The following studies were reviewed today:  EKG:  EKG ordered today and personally reviewed.  The ekg ordered today demonstrates true fibrillation controlled ventricular rate nonspecific ST  Recent Labs: 2017-12-04 creatinine 1.3 potassium 4.2 TSH was normal  Physical Exam:    VS:  BP 140/60   Pulse 65   Ht 5\' 2"  (1.575 m)   Wt 173 lb (78.5 kg)   BMI 31.64 kg/m     Wt  Readings from Last 3 Encounters:  01/18/19 173 lb (78.5 kg)  07/15/18 173 lb 1.3 oz (78.5 kg)  01/15/18 178 lb (80.7 kg)     GEN:  Well nourished, well developed in no acute distress HEENT: Normal NECK: No JVD; No carotid bruits LYMPHATICS: No lymphadenopathy CARDIAC: Regular S1 variable RRR, no murmurs, rubs, gallops RESPIRATORY:  Clear to auscultation without rales, wheezing or rhonchi  ABDOMEN: Soft, non-tender, non-distended MUSCULOSKELETAL:  No edema; No deformity  SKIN: Warm and dry  NEUROLOGIC:  Alert and oriented x 3 PSYCHIATRIC:  Normal affect    Signed, Shirlee More, MD  01/18/2019 3:10 PM    [place]

## 2019-01-18 ENCOUNTER — Ambulatory Visit (INDEPENDENT_AMBULATORY_CARE_PROVIDER_SITE_OTHER): Payer: Medicare Other | Admitting: Cardiology

## 2019-01-18 ENCOUNTER — Other Ambulatory Visit: Payer: Self-pay

## 2019-01-18 ENCOUNTER — Encounter: Payer: Self-pay | Admitting: Cardiology

## 2019-01-18 VITALS — BP 140/60 | HR 65 | Ht 62.0 in | Wt 173.0 lb

## 2019-01-18 DIAGNOSIS — E039 Hypothyroidism, unspecified: Secondary | ICD-10-CM

## 2019-01-18 DIAGNOSIS — I110 Hypertensive heart disease with heart failure: Secondary | ICD-10-CM | POA: Diagnosis not present

## 2019-01-18 DIAGNOSIS — E785 Hyperlipidemia, unspecified: Secondary | ICD-10-CM

## 2019-01-18 DIAGNOSIS — I4819 Other persistent atrial fibrillation: Secondary | ICD-10-CM | POA: Diagnosis not present

## 2019-01-18 NOTE — Patient Instructions (Signed)
Medication Instructions:  Your physician recommends that you continue on your current medications as directed. Please refer to the Current Medication list given to you today.  *If you need a refill on your cardiac medications before your next appointment, please call your pharmacy*  Lab Work: Your physician recommends that you return for lab work today: CMP, lipid panel, TSH.   If you have labs (blood work) drawn today and your tests are completely normal, you will receive your results only by: Marland Kitchen MyChart Message (if you have MyChart) OR . A paper copy in the mail If you have any lab test that is abnormal or we need to change your treatment, we will call you to review the results.  Testing/Procedures: You had an EKG today.   Follow-Up: At [facility], you and your health needs are our priority.  As part of our continuing mission to provide you with exceptional heart care, we have created designated Provider Care Teams.  These Care Teams include your primary Cardiologist (physician) and Advanced Practice Providers (APPs -  Physician Assistants and Nurse Practitioners) who all work together to provide you with the care you need, when you need it.  Your next appointment:   6 month(s)  The format for your next appointment:   In Person  Provider:   Shirlee More, MD

## 2019-01-19 LAB — COMPREHENSIVE METABOLIC PANEL
ALT: 16 IU/L (ref 0–32)
AST: 20 IU/L (ref 0–40)
Albumin/Globulin Ratio: 1.9 (ref 1.2–2.2)
Albumin: 4.4 g/dL (ref 3.5–4.6)
Alkaline Phosphatase: 107 IU/L (ref 39–117)
BUN/Creatinine Ratio: 30 — ABNORMAL HIGH (ref 12–28)
BUN: 37 mg/dL — ABNORMAL HIGH (ref 10–36)
Bilirubin Total: <0.2 mg/dL (ref 0.0–1.2)
CO2: 29 mmol/L (ref 20–29)
Calcium: 9.6 mg/dL (ref 8.7–10.3)
Chloride: 103 mmol/L (ref 96–106)
Creatinine, Ser: 1.25 mg/dL — ABNORMAL HIGH (ref 0.57–1.00)
GFR calc Af Amer: 44 mL/min/{1.73_m2} — ABNORMAL LOW (ref >59)
GFR calc non Af Amer: 38 mL/min/{1.73_m2} — ABNORMAL LOW (ref >59)
Globulin, Total: 2.3 g/dL (ref 1.5–4.5)
Glucose: 87 mg/dL (ref 65–99)
Potassium: 5.3 mmol/L — ABNORMAL HIGH (ref 3.5–5.2)
Sodium: 147 mmol/L — ABNORMAL HIGH (ref 134–144)
Total Protein: 6.7 g/dL (ref 6.0–8.5)

## 2019-01-19 LAB — LIPID PANEL
Chol/HDL Ratio: 2.9 ratio (ref 0.0–4.4)
Cholesterol, Total: 162 mg/dL (ref 100–199)
HDL: 55 mg/dL (ref >39)
LDL Chol Calc (NIH): 69 mg/dL (ref 0–99)
Triglycerides: 237 mg/dL — ABNORMAL HIGH (ref 0–149)
VLDL Cholesterol Cal: 38 mg/dL (ref 5–40)

## 2019-01-19 LAB — TSH: TSH: 2.51 u[IU]/mL (ref 0.450–4.500)

## 2019-01-26 ENCOUNTER — Ambulatory Visit: Payer: Medicare Other | Admitting: Cardiology

## 2019-04-19 DIAGNOSIS — C50111 Malignant neoplasm of central portion of right female breast: Secondary | ICD-10-CM

## 2019-06-04 ENCOUNTER — Other Ambulatory Visit: Payer: Self-pay | Admitting: Cardiology

## 2019-08-09 ENCOUNTER — Other Ambulatory Visit: Payer: Self-pay | Admitting: Cardiology

## 2019-08-18 ENCOUNTER — Ambulatory Visit: Payer: Medicare HMO | Admitting: Cardiology

## 2019-08-18 ENCOUNTER — Encounter: Payer: Self-pay | Admitting: Cardiology

## 2019-08-18 ENCOUNTER — Other Ambulatory Visit: Payer: Self-pay

## 2019-08-18 VITALS — BP 136/72 | HR 68 | Ht 62.0 in | Wt 173.0 lb

## 2019-08-18 DIAGNOSIS — R079 Chest pain, unspecified: Secondary | ICD-10-CM

## 2019-08-18 DIAGNOSIS — I4821 Permanent atrial fibrillation: Secondary | ICD-10-CM | POA: Diagnosis not present

## 2019-08-18 DIAGNOSIS — E039 Hypothyroidism, unspecified: Secondary | ICD-10-CM

## 2019-08-18 DIAGNOSIS — I110 Hypertensive heart disease with heart failure: Secondary | ICD-10-CM

## 2019-08-18 MED ORDER — NITROGLYCERIN 0.4 MG SL SUBL: 0.4000 mg | SUBLINGUAL_TABLET | SUBLINGUAL | 3 refills | Status: DC | PRN

## 2019-08-18 MED ORDER — HYDRALAZINE HCL 25 MG PO TABS: ORAL_TABLET | ORAL | 3 refills | Status: DC

## 2019-08-18 NOTE — Progress Notes (Signed)
Cardiology Office Note:    Date:  08/18/2019   ID:  [name], DOB 1926-12-10, MRN [id number]  PCP:  Kerry Einstein, MD  Cardiologist:  Kerry More, MD    Referring MD: Kerry Einstein, MD    ASSESSMENT:    1. Permanent atrial fibrillation ([name])   2. Hypertensive heart disease with heart failure ([name])   3. Acquired hypothyroidism   4. Chest pain, unspecified type    PLAN:    In order of problems listed above:  1. Stable rate is controlled without suppressant therapy aspirin for stroke prophylaxis as she had previous subdural hematoma with fall with warfarin 2. Stable BP at target no evidence of fluid overload continue current diuretic along with ACE inhibitor 3. Stable continue her thyroid supplement 4. She had an episode of atypical angina substernal pain I will give the family with a prescription for nitroglycerin that he can use if she has a severe episode at home at this time I be hesitant to do an ischemia evaluation   Next appointment: Months 6   Medication Adjustments/Labs and Tests Ordered: Current medicines are reviewed at length with the patient today.  Concerns regarding medicines are outlined above.  Orders Placed This Encounter  Procedures  . EKG 12-Lead   Meds ordered this encounter  Medications  . hydrALAZINE (APRESOLINE) 25 MG tablet    Sig: TAKE ONE-HALF TABLET BY  MOUTH 2 TIMES DAILY    Dispense:  135 tablet    Refill:  3    Requesting 1 year supply  . nitroGLYCERIN (NITROSTAT) 0.4 MG SL tablet    Sig: Place 1 tablet (0.4 mg total) under the tongue every 5 (five) minutes as needed for chest pain.    Dispense:  90 tablet    Refill:  3    Chief Complaint  Patient presents with  . Follow-up  . Congestive Heart Failure  . Atrial Fibrillation    History of Present Illness:    [name] is a 86 y.o. female with a hx of hypertensive heart disease with heart failure persistent atrial fibrillation previously on amiodarone and  hypothyroidism  last seen 01/08/2019.  She is not anticoagulated with gait dysfunction and background history of a subdural hematoma.  She had labs yesterday performed at [facility] potassium 4.3 serum sodium normal 141 creatinine 1.3 GFR 36 cc.  Profile shows a cholesterol 157 triglyceride 185 LDL 83 HDL 56 at target Compliance with diet, lifestyle and medications: Yes  She has had one episode of chest discomfort when she was in the kitchen washing dishes.  After discussion with the family alarmed him with a prescription of nitroglycerin if she has a fairly severe episode avoid ED visits.  Prior to leaving the office will check an EKG.  Otherwise she has had no cardiovascular symptoms chest pain edema shortness of breath palpitation or syncope.  She has had no falls. Past Medical History:  Diagnosis Date  . A-fib ([place])   . Anxiety   . Cancer [facility]) 2019   right breast  . Dementia ([place])   . Fibromyalgia   . Hypertension   . Hypothyroidism   . Peripheral edema     Past Surgical History:  Procedure Laterality Date  . ABDOMINAL HYSTERECTOMY    . APPENDECTOMY    . BLADDER SUSPENSION    . BREAST LUMPECTOMY WITH RADIOACTIVE SEED LOCALIZATION Right 04/15/2017   Procedure: RIGHT BREAST LUMPECTOMY WITH RADIOACTIVE SEED LOCALIZATION;  Surgeon: Erroll Luna, MD;  Location: [place];  Service: General;  Laterality: Right;  PECTORAL BLOCK; ERAS PATHWAY  . CARPAL TUNNEL RELEASE    . CHOLECYSTECTOMY    . JOINT REPLACEMENT Left    TKR  . KNEE ARTHROSCOPY Right     Current Medications: Current Meds  Medication Sig  . acetaminophen (TYLENOL) 500 MG tablet Take 500 mg by mouth every 6 (six) hours as needed.  Marland Kitchen anastrozole (ARIMIDEX) 1 MG tablet Take 1 mg by mouth daily.  . Ascorbic Acid (VITAMIN C) 1000 MG tablet Take 1,000 mg by mouth daily.   Marland Kitchen aspirin EC 81 MG tablet Take 81 mg by mouth daily.  Marland Kitchen donepezil (ARICEPT) 10 MG tablet TAKE 1 TABLET EVERY NIGHT  .  esomeprazole (NEXIUM) 40 MG capsule Take 40 mg by mouth daily at 12 noon.  . hydrALAZINE (APRESOLINE) 25 MG tablet TAKE ONE-HALF TABLET BY  MOUTH 2 TIMES DAILY  . HYDROcodone-acetaminophen (NORCO/VICODIN) 5-325 MG tablet Take 1 tablet every 8 hours as needed for severe low back pain  . levothyroxine (SYNTHROID, LEVOTHROID) 125 MCG tablet TAKE 1 TABLET DAILY AS DIRECTED  . lisinopril (PRINIVIL,ZESTRIL) 40 MG tablet Take 40 mg by mouth daily.   . memantine (NAMENDA) 10 MG tablet 10 mg 2 (two) times daily.   . Misc. Devices (WALKER) MISC Use rolling walker as directed  . oxybutynin (DITROPAN) 5 MG tablet Take 5 mg by mouth 2 (two) times daily.   . sertraline (ZOLOFT) 50 MG tablet Take 50 mg by mouth daily.   . simvastatin (ZOCOR) 20 MG tablet Take 1 tablet (20 mg total) by mouth daily.  Marland Kitchen torsemide (DEMADEX) 20 MG tablet Take 1 tablet (20 mg total) by mouth 2 (two) times daily. Pt must keep appt date in July for further refills  . traZODone (DESYREL) 50 MG tablet Take 100 mg by mouth at bedtime.  . [DISCONTINUED] hydrALAZINE (APRESOLINE) 25 MG tablet TAKE ONE-HALF TABLET BY  MOUTH 3 TIMES DAILY     Allergies:   Patient has no known allergies.   Social History   Socioeconomic History  . Marital status: Married    Spouse name: Not on file  . Number of children: Not on file  . Years of education: Not on file  . Highest education level: Not on file  Occupational History  . Not on file  Tobacco Use  . Smoking status: Never Smoker  . Smokeless tobacco: Never Used  Vaping Use  . Vaping Use: Never used  Substance and Sexual Activity  . Alcohol use: No  . Drug use: No  . Sexual activity: Not on file  Other Topics Concern  . Not on file  Social History Narrative  . Not on file   Social Determinants of Health   Financial Resource Strain:   . Difficulty of Paying Living Expenses:   Food Insecurity:   . Worried About Charity fundraiser in the Last Year:   . Arboriculturist in the Last  Year:   Transportation Needs:   . Film/video editor (Medical):   Marland Kitchen Lack of Transportation (Non-Medical):   Physical Activity:   . Days of Exercise per Week:   . Minutes of Exercise per Session:   Stress:   . Feeling of Stress :   Social Connections:   . Frequency of Communication with Friends and Family:   . Frequency of Social Gatherings with Friends and Family:   . Attends Religious Services:   . Active Member of  Clubs or Organizations:   . Attends Archivist Meetings:   Marland Kitchen Marital Status:      Family History: The patient's family history includes Breast cancer in her sister; CAD in her brother; CVA in her father; Cancer in her brother; Diabetes in her brother, mother, and sister; Heart attack in her mother; Hypertension in her father, sister, and sister. ROS:   Please see the history of present illness.    All other systems reviewed and are negative.  EKGs/Labs/Other Studies Reviewed:    The following studies were reviewed today:  EKG:  EKG ordered today and personally reviewed.  The ekg ordered today demonstrates atrial fibrillation rate 61 bpm QS V2 old anterior septal MI versus lead placement  Recent Labs: 01/18/2019: ALT 16; BUN 37; Creatinine, Ser 1.25; Potassium 5.3; Sodium 147; TSH 2.510  Recent Lipid Panel    Component Value Date/Time   CHOL 162 01/18/2019 1523   TRIG 237 (H) 01/18/2019 1523   HDL 55 01/18/2019 1523   CHOLHDL 2.9 01/18/2019 1523   LDLCALC 69 01/18/2019 1523    Physical Exam:    VS:  BP 136/72 (BP Location: Right Arm, Patient Position: Sitting, Cuff Size: Normal)   Pulse 68   Ht 5\' 2"  (1.575 m)   Wt 173 lb (78.5 kg)   SpO2 97%   BMI 31.64 kg/m     Wt Readings from Last 3 Encounters:  08/18/19 173 lb (78.5 kg)  01/18/19 173 lb (78.5 kg)  07/15/18 173 lb 1.3 oz (78.5 kg)     GEN: Appears her age well nourished, well developed in no acute distress HEENT: Normal NECK: No JVD; No carotid bruits LYMPHATICS: No  lymphadenopathy CARDIAC: Irregular S1 variable no murmurs, rubs, gallops RESPIRATORY:  Clear to auscultation without rales, wheezing or rhonchi  ABDOMEN: Soft, non-tender, non-distended MUSCULOSKELETAL:  No edema; No deformity  SKIN: Warm and dry NEUROLOGIC:  Alert and oriented x 3 PSYCHIATRIC:  Normal affect    Signed, Kerry More, MD  08/18/2019 2:54 PM    [facility]

## 2019-08-18 NOTE — Patient Instructions (Signed)
Medication Instructions:  Your physician has recommended you make the following change in your medication:  START: Nitroglycerin 0.4 mg take one tablet by mouth every 5 minutes up to three times as needed for chest pain.  DECREASE: Hydralazine to only taking it twice daily.  *If you need a refill on your cardiac medications before your next appointment, please call your pharmacy*   Lab Work: None If you have labs (blood work) drawn today and your tests are completely normal, you will receive your results only by: Marland Kitchen MyChart Message (if you have MyChart) OR . A paper copy in the mail If you have any lab test that is abnormal or we need to change your treatment, we will call you to review the results.   Testing/Procedures: None   Follow-Up: At [facility], you and your health needs are our priority.  As part of our continuing mission to provide you with exceptional heart care, we have created designated Provider Care Teams.  These Care Teams include your primary Cardiologist (physician) and Advanced Practice Providers (APPs -  Physician Assistants and Nurse Practitioners) who all work together to provide you with the care you need, when you need it.  We recommend signing up for the patient portal called "MyChart".  Sign up information is provided on this After Visit Summary.  MyChart is used to connect with patients for Virtual Visits (Telemedicine).  Patients are able to view lab/test results, encounter notes, upcoming appointments, etc.  Non-urgent messages can be sent to your provider as well.   To learn more about what you can do with MyChart, go to [email or web address].    Your next appointment:   6 month(s)  The format for your next appointment:   In Person  Provider:   Shirlee More, MD   Other Instructions

## 2019-09-13 ENCOUNTER — Other Ambulatory Visit: Payer: Self-pay | Admitting: Cardiology

## 2019-09-28 DIAGNOSIS — C50111 Malignant neoplasm of central portion of right female breast: Secondary | ICD-10-CM | POA: Diagnosis not present

## 2019-11-10 IMAGING — US US BREAST BX W LOC DEV 1ST LESION IMG BX SPEC US GUIDE*R*
1 series · 11 of 11 positions shown · non-contrast
Comparison: Previous exam(s).

ADDENDUM:
Pathology revealed GRADE I INVASIVE MAMMARY CARCINOMA of the Right,
5 o'clock. This was found to be concordant by Dr. Dulovic Lie.
Pathology results were discussed with the patient by telephone. The
patient reported doing well after the biopsy with tenderness at the
site. Post biopsy instructions and care were reviewed and questions
were answered. The patient was encouraged to call The [REDACTED]
consultation has been arranged with Dr. Momirka Kuljhavi at [REDACTED] on March 31, 2017.

Pathology results reported by Vlab Omonov, RN on 03/26/2017.
CLINICAL DATA: 89-year-old female presenting for ultrasound-guided
biopsy of a right breast mass.
EXAM:
ULTRASOUND GUIDED RIGHT BREAST CORE NEEDLE BIOPSY

[Series 1: us breast bx w loc dev 1st lesion img bx spec us g · 0.07mm/px · 11 of 11 slices shown]
[im 1/11]
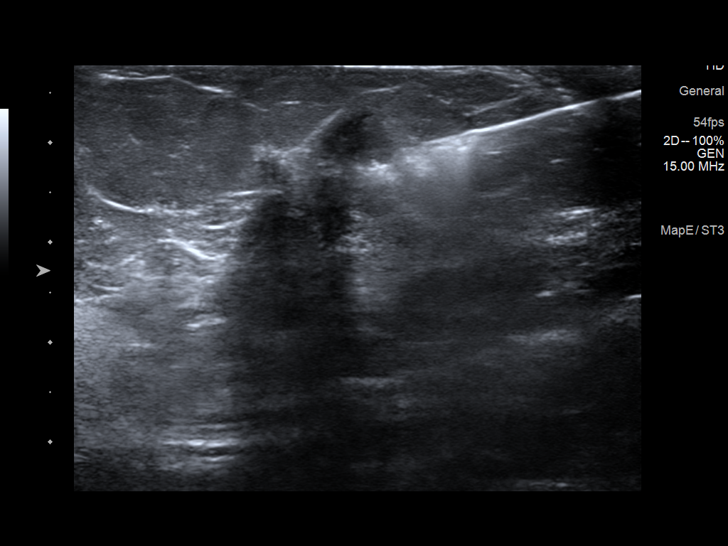
[im 2/11]
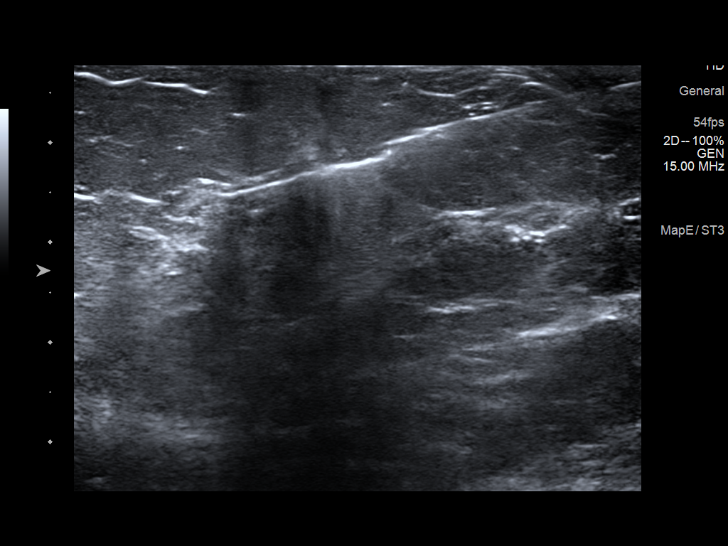
[im 3/11]
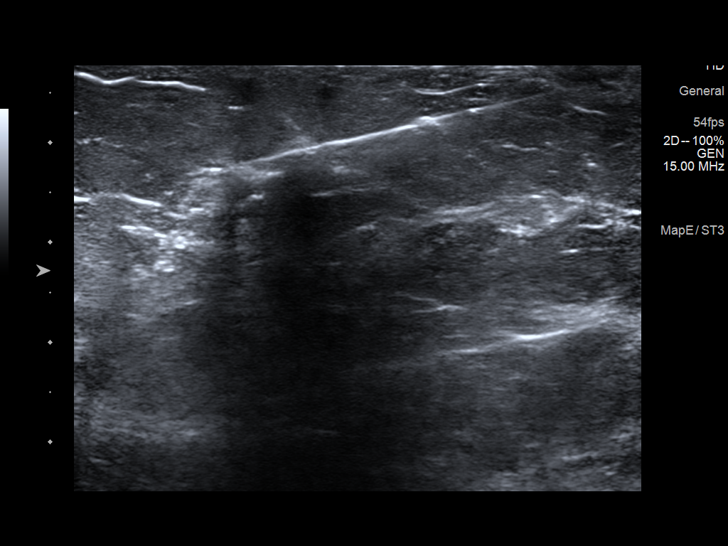
[im 4/11]
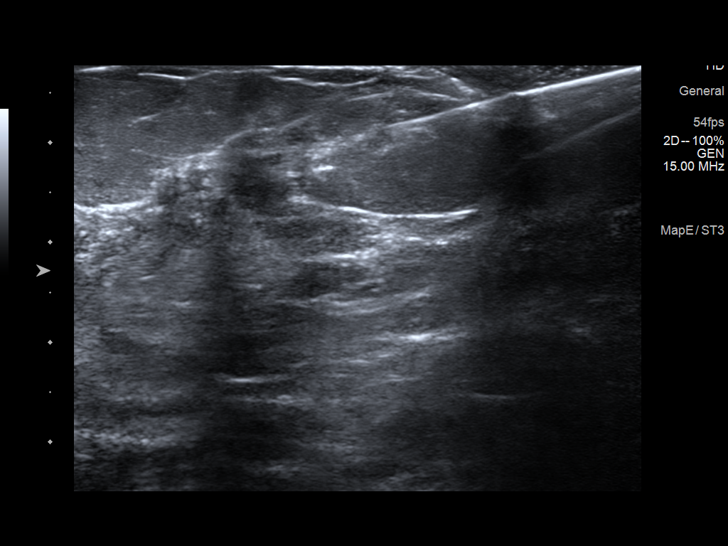
[im 5/11]
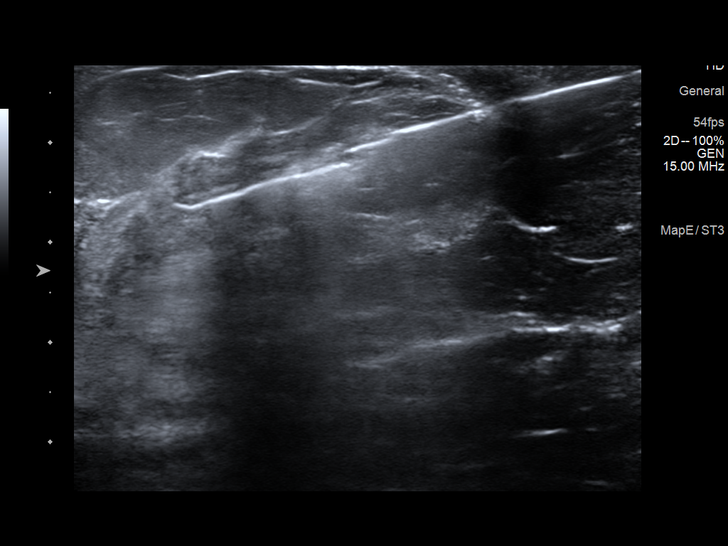
[im 6/11]
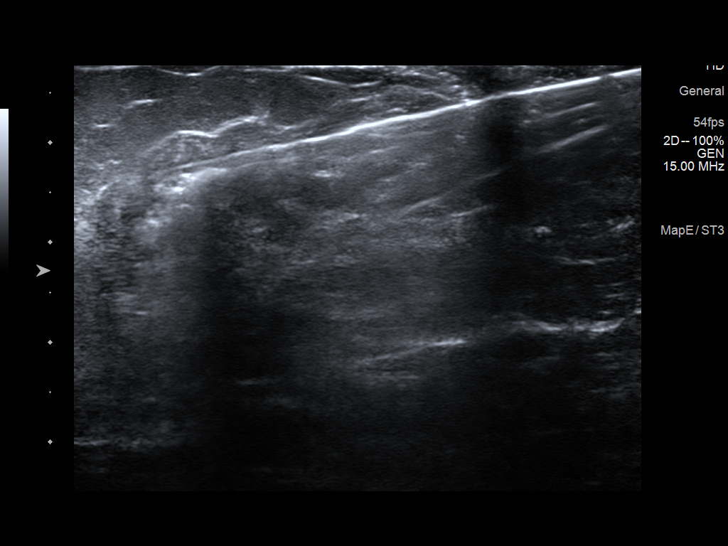
[im 7/11]
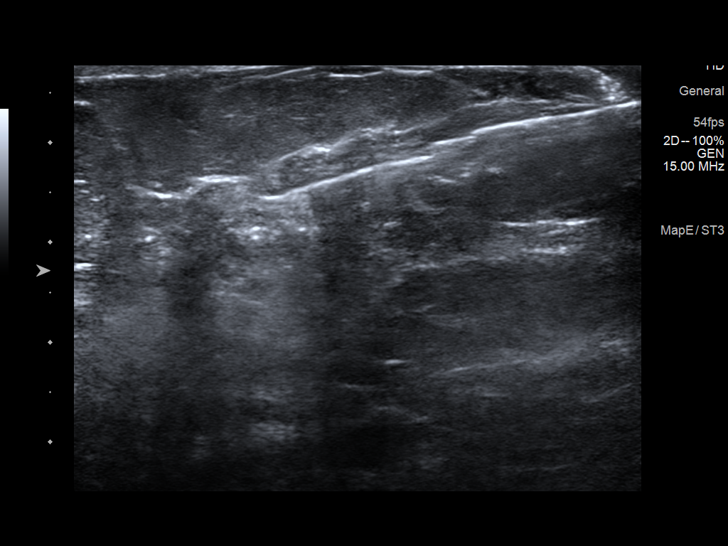
[im 8/11]
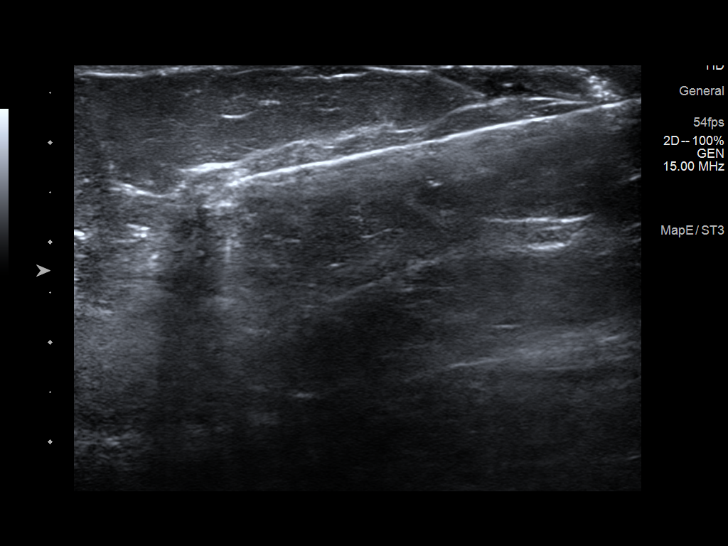
[im 9/11]
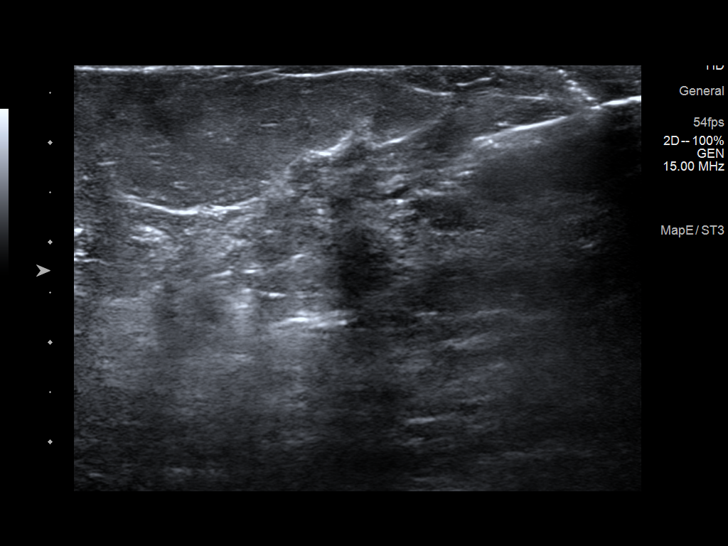
[im 10/11]
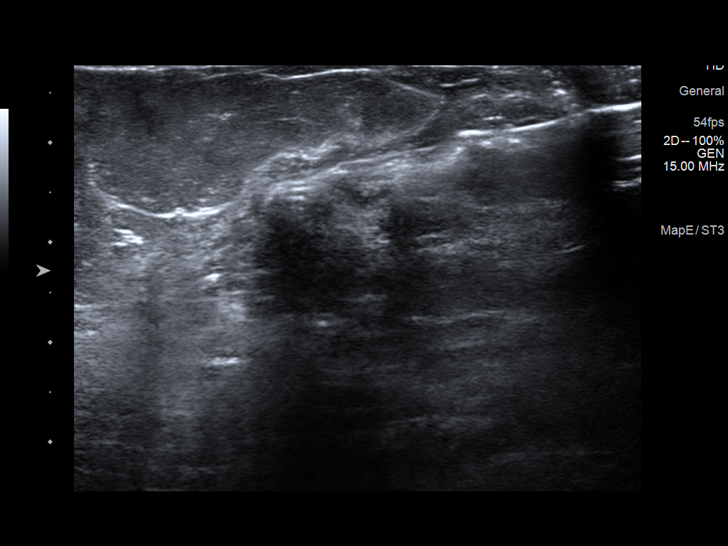
[im 11/11]
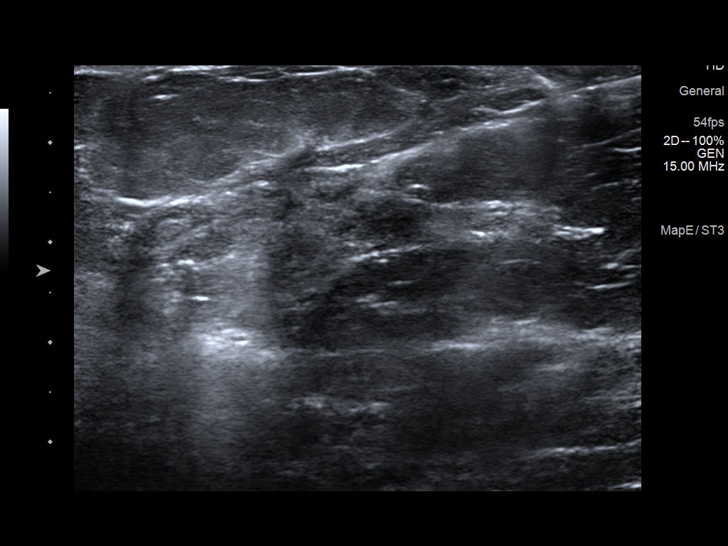

[11 of 11 positions shown; findings below may reference images not displayed]



Lesion quadrant: Lower-inner quadrant

Using sterile technique and 1% Lidocaine as local anesthetic, under
direct ultrasound visualization, a 14 gauge Rifici device was
used to perform biopsy of a mass in the left lower inner
retroareolar right breast at 5 o'clock using a medial approach. At
the conclusion of the procedure a ribbon shaped tissue marker clip
was deployed into the biopsy cavity. Follow up 2 view mammogram was
performed and dictated separately.
IMPRESSION: Ultrasound guided biopsy of right breast mass at 5 o'clock. No
apparent complications.

## 2019-11-30 IMAGING — US US NEEDLE LOCALIZATION*R*
1 series · 4 of 4 positions shown · non-contrast
Comparison: Previous exam(s).

CLINICAL DATA: 89-year-old female with recently diagnosed right
breast invasive mammary carcinoma at the 5 o'clock position
periareolar. Patient presents for radioactive seed localization
right breast.

EXAM:
ULTRASOUND GUIDED RADIOACTIVE SEED LOCALIZATION OF THE RIGHT BREAST
NEEDLE LOCALIZATION OF THE RIGHT BREAST WITH ULTRASOUND GUIDANCE

[Series 1: us needle localization*right* · 0.07mm/px · 4 of 4 slices shown]
[im 1/4]
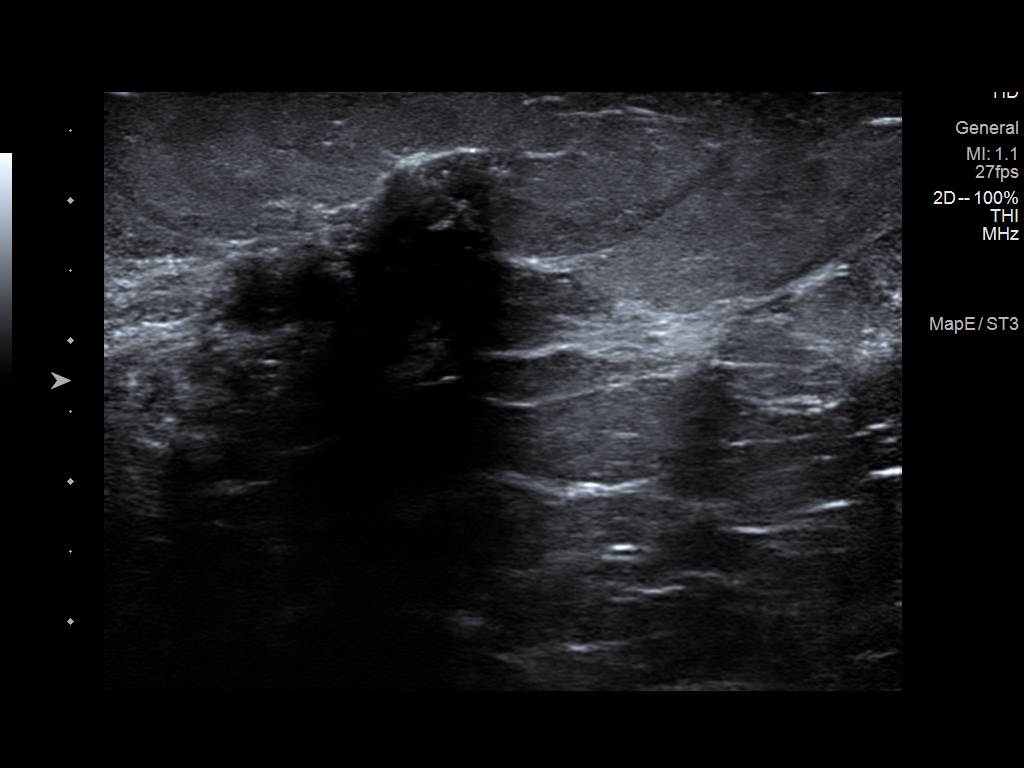
[im 2/4]
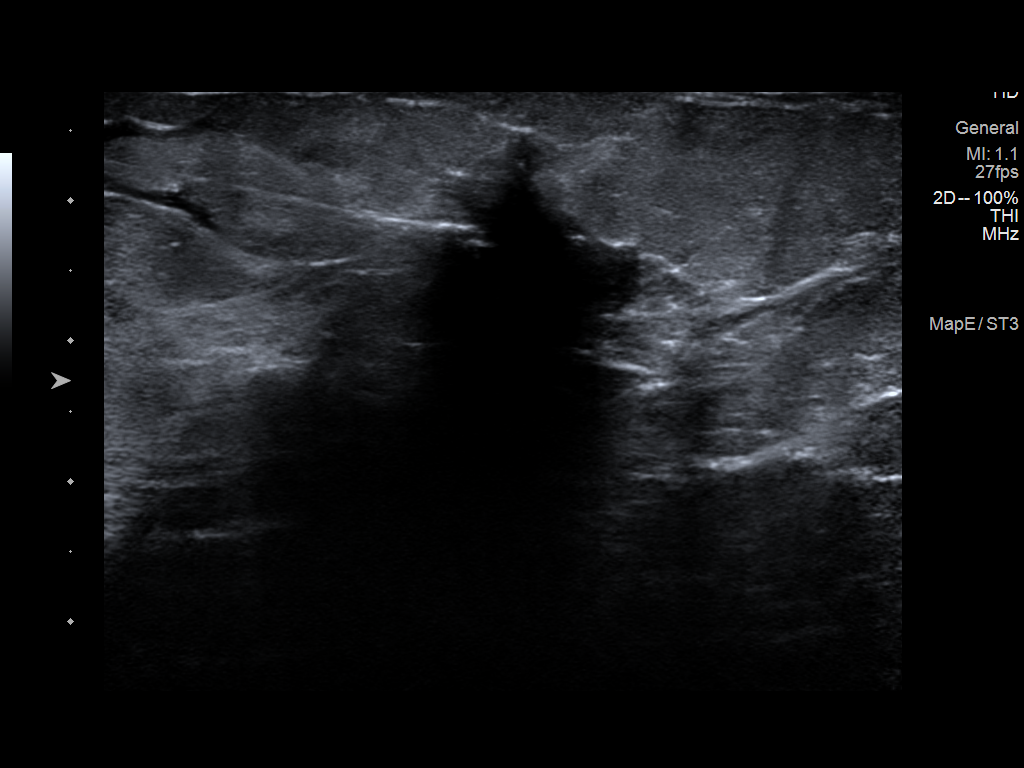
[im 3/4]
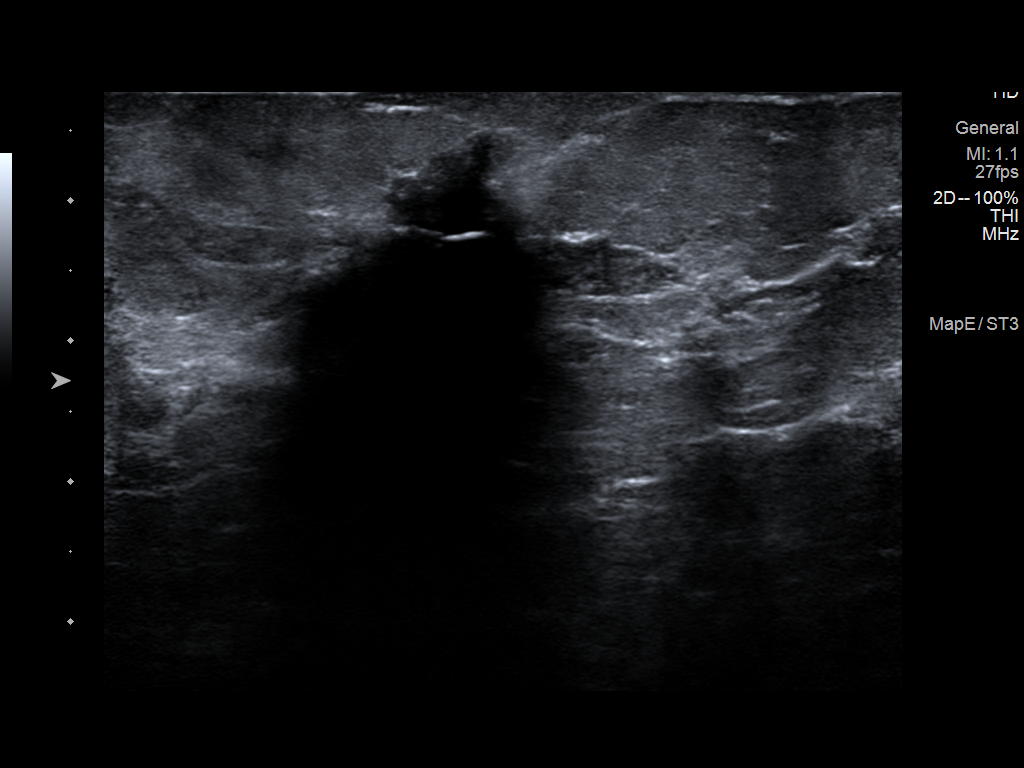
[im 4/4]
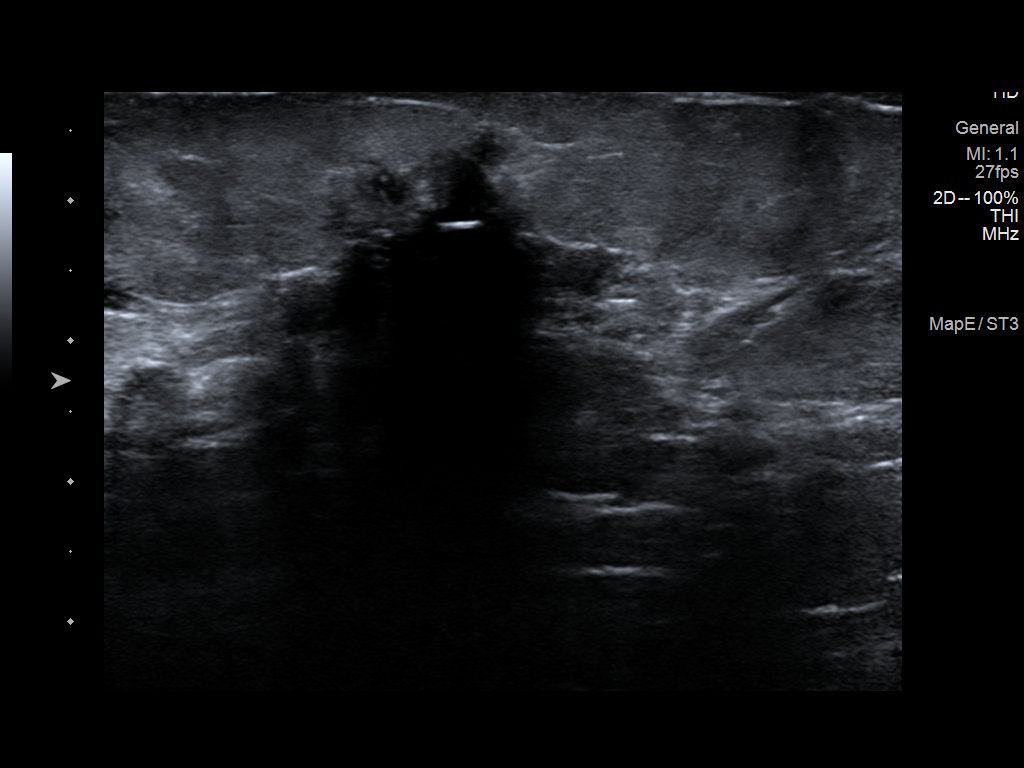

[4 of 4 positions shown; findings below may reference images not displayed]

FINDINGS: Patient presents for radioactive seed localization prior to right
breast lumpectomy. I met with the patient and we discussed the
procedure of seed localization including benefits and alternatives.
We discussed the high likelihood of a successful procedure. We
discussed the risks of the procedure including infection, bleeding,
tissue injury and further surgery. We discussed the low dose of
radioactivity involved in the procedure. Informed, written consent
was given.

The usual time-out protocol was performed immediately prior to the
procedure.

Using ultrasound guidance, sterile technique, 1% lidocaine and an
7-OMR radioactive seed, the mass in the right breast with associated
distortion at 5 o'clock periareolar was localized using a lateral to
medial approach. The follow-up mammogram images confirm the seed in
the expected location and were marked for Dr. Lei.

Follow-up survey of the patient confirms presence of the radioactive
seed.

Order number of 7-OMR seed:  [id number].

Total activity:  0.253 millicuries reference Date: 04/07/2017

The patient tolerated the procedure well and was released from the
[REDACTED]. She was given instructions regarding seed removal.

Patient presents for needle localization prior to right breast
lumpectomy. I met with the patient and we discussed the procedure of
needle localization including benefits and alternatives. We
discussed the high likelihood of a successful procedure. We
discussed the risks of the procedure, including infection, bleeding,
tissue injury, and further surgery. Informed, written consent was
given. The usual time-out protocol was performed immediately prior
to the procedure.

Using ultrasound guidance, sterile technique, 1% lidocaine and a 7
cm modified Kopans needle, the mass in the right breast at 5 o'clock
periareolar was localized using lateral to medial approach. The
images were marked for Dr. Lei.
IMPRESSION: Radioactive seed localization right breast. No apparent
complications.

## 2020-01-08 ENCOUNTER — Other Ambulatory Visit: Payer: Self-pay | Admitting: Cardiology

## 2020-02-25 DIAGNOSIS — I619 Nontraumatic intracerebral hemorrhage, unspecified: Secondary | ICD-10-CM | POA: Insufficient documentation

## 2020-02-28 DIAGNOSIS — F0150 Vascular dementia without behavioral disturbance: Secondary | ICD-10-CM | POA: Insufficient documentation

## 2020-04-03 ENCOUNTER — Other Ambulatory Visit: Payer: Self-pay | Admitting: Cardiology

## 2020-04-03 NOTE — Telephone Encounter (Signed)
Rx refill sent to pharmacy. 

## 2020-10-13 DIAGNOSIS — F419 Anxiety disorder, unspecified: Secondary | ICD-10-CM | POA: Insufficient documentation

## 2020-10-13 DIAGNOSIS — I10 Essential (primary) hypertension: Secondary | ICD-10-CM | POA: Insufficient documentation

## 2020-10-13 DIAGNOSIS — R609 Edema, unspecified: Secondary | ICD-10-CM | POA: Insufficient documentation

## 2020-10-13 DIAGNOSIS — R600 Localized edema: Secondary | ICD-10-CM | POA: Insufficient documentation

## 2020-10-13 DIAGNOSIS — M797 Fibromyalgia: Secondary | ICD-10-CM | POA: Insufficient documentation

## 2020-10-29 NOTE — Progress Notes (Signed)
Cardiology Office Note:    Date:  10/30/2020   ID:  [name], DOB [birth year], MRN [id number]  PCP:  Karlene Einstein, MD  Cardiologist:  Shirlee More, MD    Referring MD: Karlene Einstein, MD    ASSESSMENT:    1. Hypertensive heart disease with heart failure ([place])   2. Permanent atrial fibrillation ([place])   3. Acquired hypothyroidism   4. Hyperlipidemia, unspecified hyperlipidemia type    PLAN:    In order of problems listed above:  Stable BP at target for age typically I like to see his systolic less than 622 after 87 years old continue her current medical regimen including loop diuretic and ARB. Stable rate controlled not on suppressant therapy and tolerates her Aricept without bradycardia.  She is not anticoagulated Stable continue thyroid supplement labs requested from her PCP Stable continue statin I am going to transition her to oral nitrate to improve the quality of her life told her daughter I just do not see her as a candidate for coronary interventions.  I would not pursue an ischemia evaluation.   Next appointment: 9 months   Medication Adjustments/Labs and Tests Ordered: Current medicines are reviewed at length with the patient today.  Concerns regarding medicines are outlined above.  No orders of the defined types were placed in this encounter.  No orders of the defined types were placed in this encounter.   Chief Complaint  Patient presents with   Follow-up   Congestive Heart Failure   Hypertension   Atrial Fibrillation    History of Present Illness:    [name] is a 87 y.o. female with a hx of hypertensive heart disease with heart failure persistent atrial fibrillation longstanding failing amiodarone to maintain sinus rhythm hypothyroidism last seen 08/18/2019.  She is not anticoagulated because of gait dysfunction a background history of previous subdural hematoma.  At her last visit she had had an episode of chest pain and the family is  given a prescription of nitroglycerin that they could use as needed.  Compliance with diet, lifestyle and medications: Yes  Her daughter is present participates in care and decision making. They both had COVID in February the daughter ended up in the hospital for a week with respiratory failure and the mother transition to skilled nursing at that time. Infrequently she has chest pain but on 1 episode required 3 nitroglycerin for relief. No edema orthopnea shortness of breath palpitation or syncope. Past Medical History:  Diagnosis Date   A-fib ([place])    Anxiety    Cancer ([place]) 2019   right breast   Dementia ([place])    Fibromyalgia    Hypertension    Hypothyroidism    Peripheral edema     Past Surgical History:  Procedure Laterality Date   ABDOMINAL HYSTERECTOMY     APPENDECTOMY     BLADDER SUSPENSION     BREAST LUMPECTOMY WITH RADIOACTIVE SEED LOCALIZATION Right 04/15/2017   Procedure: RIGHT BREAST LUMPECTOMY WITH RADIOACTIVE SEED LOCALIZATION;  Surgeon: Erroll Luna, MD;  Location: [place];  Service: General;  Laterality: Right;  PECTORAL BLOCK; ERAS PATHWAY   CARPAL TUNNEL RELEASE     CHOLECYSTECTOMY     JOINT REPLACEMENT Left    TKR   KNEE ARTHROSCOPY Right     Current Medications: Current Meds  Medication Sig   anastrozole (ARIMIDEX) 1 MG tablet Take 1 mg by mouth daily.   donepezil (ARICEPT) 5 MG tablet TAKE 1 TABLET EVERY  NIGHT   esomeprazole (NEXIUM) 40 MG capsule Take 40 mg by mouth daily at 12 noon.   levothyroxine (SYNTHROID, LEVOTHROID) 125 MCG tablet TAKE 1 TABLET DAILY AS DIRECTED     Allergies:   Patient has no known allergies.   Social History   Socioeconomic History   Marital status: Married    Spouse name: Not on file   Number of children: Not on file   Years of education: Not on file   Highest education level: Not on file  Occupational History   Not on file  Tobacco Use   Smoking status: Never   Smokeless tobacco: Never   Vaping Use   Vaping Use: Never used  Substance and Sexual Activity   Alcohol use: No   Drug use: No   Sexual activity: Not on file  Other Topics Concern   Not on file  Social History Narrative   Not on file   Social Determinants of Health   Financial Resource Strain: Not on file  Food Insecurity: Not on file  Transportation Needs: Not on file  Physical Activity: Not on file  Stress: Not on file  Social Connections: Not on file     Family History: The patient's family history includes Breast cancer in her sister; CAD in her brother; CVA in her father; Cancer in her brother; Diabetes in her brother, mother, and sister; Heart attack in her mother; Hypertension in her father, sister, and sister. ROS:   Please see the history of present illness.    All other systems reviewed and are negative.  EKGs/Labs/Other Studies Reviewed:    The following studies were reviewed today:  EKG:  EKG ordered today and personally reviewed.  The ekg ordered today demonstrates atrial fibrillation controlled ventricular rate rightward QRS axis QS in V2 consider old anterior septal MI unchanged compared 08/18/2019  Recent Labs: Requested from her skilled nursing facility PCP Recent Lipid Panel    Component Value Date/Time   CHOL 162 01/18/2019 1523   TRIG 237 (H) 01/18/2019 1523   HDL 55 01/18/2019 1523   CHOLHDL 2.9 01/18/2019 1523   LDLCALC 69 01/18/2019 1523    Physical Exam:    VS:  BP (!) 148/82 (BP Location: Right Arm, Patient Position: Sitting)   Pulse 81   Ht 5\' 2"  (1.575 m)   Wt 190 lb (86.2 kg)   SpO2 91%   BMI 34.75 kg/m     Wt Readings from Last 3 Encounters:  10/30/20 190 lb (86.2 kg)  08/18/19 173 lb (78.5 kg)  01/18/19 173 lb (78.5 kg)     GEN: Apathetic well nourished, well developed in no acute distress HEENT: Normal NECK: No JVD; No carotid bruits LYMPHATICS: No lymphadenopathy CARDIAC: Irregular rate and rhythm no murmurs, rubs, gallops RESPIRATORY:  Clear  to auscultation without rales, wheezing or rhonchi  ABDOMEN: Soft, non-tender, non-distended MUSCULOSKELETAL:  No edema; No deformity  SKIN: Warm and dry NEUROLOGIC:  Alert and oriented x 3 PSYCHIATRIC:  Normal affect    Signed, Shirlee More, MD  10/30/2020 11:30 AM    [facility]

## 2020-10-30 ENCOUNTER — Other Ambulatory Visit: Payer: Self-pay

## 2020-10-30 ENCOUNTER — Ambulatory Visit (INDEPENDENT_AMBULATORY_CARE_PROVIDER_SITE_OTHER): Payer: Medicare Other | Admitting: Cardiology

## 2020-10-30 ENCOUNTER — Encounter: Payer: Self-pay | Admitting: Cardiology

## 2020-10-30 VITALS — BP 148/82 | HR 81 | Ht 62.0 in | Wt 190.0 lb

## 2020-10-30 DIAGNOSIS — I110 Hypertensive heart disease with heart failure: Secondary | ICD-10-CM

## 2020-10-30 DIAGNOSIS — E785 Hyperlipidemia, unspecified: Secondary | ICD-10-CM | POA: Diagnosis not present

## 2020-10-30 DIAGNOSIS — E039 Hypothyroidism, unspecified: Secondary | ICD-10-CM

## 2020-10-30 DIAGNOSIS — I4821 Permanent atrial fibrillation: Secondary | ICD-10-CM

## 2020-10-30 DIAGNOSIS — I209 Angina pectoris, unspecified: Secondary | ICD-10-CM

## 2020-10-30 MED ORDER — ISOSORBIDE MONONITRATE ER 30 MG PO TB24: 30.0000 mg | ORAL_TABLET | Freq: Every day | ORAL | 3 refills | Status: AC

## 2020-10-30 NOTE — Patient Instructions (Signed)
Medication Instructions:  Your physician has recommended you make the following change in your medication:  START: Imdur 30 mg take one tablet by mouth daily.  *If you need a refill on your cardiac medications before your next appointment, please call your pharmacy*   Lab Work: None If you have labs (blood work) drawn today and your tests are completely normal, you will receive your results only by: [employer] (if you have MyChart) OR A paper copy in the mail If you have any lab test that is abnormal or we need to change your treatment, we will call you to review the results.   Testing/Procedures: None   Follow-Up: At [facility], you and your health needs are our priority.  As part of our continuing mission to provide you with exceptional heart care, we have created designated Provider Care Teams.  These Care Teams include your primary Cardiologist (physician) and Advanced Practice Providers (APPs -  Physician Assistants and Nurse Practitioners) who all work together to provide you with the care you need, when you need it.  We recommend signing up for the patient portal called "MyChart".  Sign up information is provided on this After Visit Summary.  MyChart is used to connect with patients for Virtual Visits (Telemedicine).  Patients are able to view lab/test results, encounter notes, upcoming appointments, etc.  Non-urgent messages can be sent to your provider as well.   To learn more about what you can do with MyChart, go to [email or web address].    Your next appointment:   9 month(s)  The format for your next appointment:   In Person  Provider:   Shirlee More, MD   Other Instructions

## 2021-07-27 ENCOUNTER — Ambulatory Visit: Payer: Medicare Other | Admitting: Cardiology

## 2021-10-15 NOTE — Progress Notes (Unsigned)
Cardiology Office Note:    Date:  10/16/2021   ID:  [name], DOB July 26, 1926, MRN [id number]  PCP:  Karlene Einstein, MD  Cardiologist:  Shirlee More, MD    Referring MD: Karlene Einstein, MD    ASSESSMENT:    1. Hypertensive heart disease with heart failure ([place])   2. Permanent atrial fibrillation ([place])   3. Angina pectoris ([place])   4. Hyperlipidemia, unspecified hyperlipidemia type    PLAN:    In order of problems listed above:  [name] is doing well with the transition to skilled nursing her heart failure is compensated she will continue her current loop diuretic request a copy of her at least recent labs.  Blood pressure is at target with ARB appropriate for patients with heart failure. Stable rate is controlled without suppressant treatment and obviously not anticoagulated with her previous subdural hematoma Stable not having frequent angina and she will continue oral nitrate and as needed nitroglycerin I would not perform an ischemia evaluation Continue her current statin   Next appointment: She will see me in 6 months in [facility] office   Medication Adjustments/Labs and Tests Ordered: Current medicines are reviewed at length with the patient today.  Concerns regarding medicines are outlined above.  No orders of the defined types were placed in this encounter.  No orders of the defined types were placed in this encounter.   Chief Complaint  Patient presents with   Follow-up   Congestive Heart Failure   Atrial Fibrillation    History of Present Illness:    [name] is a 88 y.o. female with a hx of hypertensive heart disease with heart failure persistent atrial fibrillation failing amiodarone therapy to maintain a sinus rhythm hypothyroidism and gait dysfunction previous subdural hematoma and not anticoagulated last seen 10/30/2020.  She has had episodes of chest pain and was prescribed nitroglycerin.  Compliance with diet, lifestyle and  medications: Yes  She has done well with the transition to skilled nursing she has had no falls if anything her heart failure is better and she has frequent visits and lab test performed and I requested a copy of her lab results She has a small degree of peripheral edema no orthopnea shortness of breath chest pain palpitation or syncope She is not anticoagulated She is had no further angina Past Medical History:  Diagnosis Date   A-fib ([place])    Anxiety    Cancer ([employer]) 2019   right breast   Dementia ([place])    Dizziness 01/06/2018   Encounter for wellness examination in adult 04/18/2015   Fibromyalgia    Hypertension    Hypothyroidism    Malignant neoplasm of lower-inner quadrant of right breast of female, estrogen receptor positive ([place]) 06/20/2017   Peripheral edema     Past Surgical History:  Procedure Laterality Date   ABDOMINAL HYSTERECTOMY     APPENDECTOMY     BLADDER SUSPENSION     BREAST LUMPECTOMY WITH RADIOACTIVE SEED LOCALIZATION Right 04/15/2017   Procedure: RIGHT BREAST LUMPECTOMY WITH RADIOACTIVE SEED LOCALIZATION;  Surgeon: Erroll Luna, MD;  Location: [facility];  Service: General;  Laterality: Right;  PECTORAL BLOCK; ERAS PATHWAY   CARPAL TUNNEL RELEASE     CHOLECYSTECTOMY     JOINT REPLACEMENT Left    TKR   KNEE ARTHROSCOPY Right     Current Medications: Current Meds  Medication Sig   acetaminophen (TYLENOL) 500 MG tablet Take 1,000 mg by mouth every 12 (twelve)  hours as needed for mild pain.   anastrozole (ARIMIDEX) 1 MG tablet Take 1 mg by mouth daily.   benzonatate (TESSALON) 100 MG capsule Take 100 mg by mouth daily as needed for cough.   diclofenac Sodium (VOLTAREN) 1 % GEL Apply topically 2 (two) times daily. Apply to both knees am and pm daily   donepezil (ARICEPT) 5 MG tablet TAKE 1 TABLET EVERY NIGHT   esomeprazole (NEXIUM) 40 MG capsule Take 40 mg by mouth daily at 12 noon.   isosorbide mononitrate (IMDUR) 30 MG 24 hr tablet Take 1  tablet (30 mg total) by mouth daily.   levothyroxine (SYNTHROID, LEVOTHROID) 125 MCG tablet TAKE 1 TABLET DAILY AS DIRECTED   loratadine (CLARITIN) 10 MG tablet Take 10 mg by mouth daily.   losartan (COZAAR) 100 MG tablet Take 100 mg by mouth daily.   memantine (NAMENDA) 10 MG tablet 10 mg 2 (two) times daily.    Misc. Devices (WALKER) MISC Use rolling walker as directed   nitroGLYCERIN (NITROSTAT) 0.4 MG SL tablet PLACE 1 TABLET UNDER THE TONGUE EVERY 5 MINUTES AS NEEDED FOR CHEST PAIN   oxybutynin (DITROPAN) 5 MG tablet Take 1 tablet by mouth 2 (two) times daily.   sertraline (ZOLOFT) 100 MG tablet TAKE  1 AND 1/2 TABLET (150 MG) DAILY AT BEDTIME   sertraline (ZOLOFT) 25 MG tablet Take 25 mg by mouth daily. Take with 100 mg tablet = 125 mg   simvastatin (ZOCOR) 20 MG tablet Take 1 tablet (20 mg total) by mouth daily.   torsemide (DEMADEX) 20 MG tablet TAKE 1 TABLET TWICE DAILY (Patient taking differently: Take 20 mg by mouth daily.)   traMADol (ULTRAM) 50 MG tablet Take 50 mg by mouth daily as needed for pain.   traZODone (DESYREL) 100 MG tablet Take 100 mg by mouth at bedtime.     Allergies:   Patient has no known allergies.   Social History   Socioeconomic History   Marital status: Married    Spouse name: Not on file   Number of children: Not on file   Years of education: Not on file   Highest education level: Not on file  Occupational History   Not on file  Tobacco Use   Smoking status: Never   Smokeless tobacco: Never  Vaping Use   Vaping Use: Never used  Substance and Sexual Activity   Alcohol use: No   Drug use: No   Sexual activity: Not on file  Other Topics Concern   Not on file  Social History Narrative   Not on file   Social Determinants of Health   Financial Resource Strain: Not on file  Food Insecurity: Not on file  Transportation Needs: Not on file  Physical Activity: Not on file  Stress: Not on file  Social Connections: Not on file     Family  History: The patient's family history includes Breast cancer in her sister; CAD in her brother; CVA in her father; Cancer in her brother; Diabetes in her brother, mother, and sister; Heart attack in her mother; Hypertension in her father, sister, and sister. ROS:   Please see the history of present illness.    All other systems reviewed and are negative.  EKGs/Labs/Other Studies Reviewed:    The following studies were reviewed today:  EKG:  EKG ordered today and personally reviewed.  The ekg ordered today demonstrates atrial fibrillation controlled ventricular rate right bundle branch block and repolarization changes  Recent Labs: No results found  for requested labs within last 365 days.  Recent Lipid Panel    Component Value Date/Time   CHOL 162 01/18/2019 1523   TRIG 237 (H) 01/18/2019 1523   HDL 55 01/18/2019 1523   CHOLHDL 2.9 01/18/2019 1523   LDLCALC 69 01/18/2019 1523    Physical Exam:    VS:  BP 136/76 (BP Location: Left Arm, Patient Position: Sitting)   Pulse 82   Ht '5\' 2"'$  (1.575 m)   Wt 191 lb (86.6 kg)   SpO2 95%   BMI 34.93 kg/m     Wt Readings from Last 3 Encounters:  10/16/21 191 lb (86.6 kg)  10/30/20 190 lb (86.2 kg)  08/18/19 173 lb (78.5 kg)     GEN:  Well nourished, well developed in no acute distress HEENT: Normal NECK: No JVD; No carotid bruits LYMPHATICS: No lymphadenopathy CARDIAC: Irregular rate and rhythm  no murmurs, rubs, gallops RESPIRATORY:  Clear to auscultation without rales, wheezing or rhonchi  ABDOMEN: Soft, non-tender, non-distended MUSCULOSKELETAL:  No edema; No deformity  SKIN: Warm and dry NEUROLOGIC:  Alert and oriented x 3 PSYCHIATRIC:  Normal affect    Signed, Shirlee More, MD  10/16/2021 2:43 PM    [place]

## 2021-10-16 ENCOUNTER — Encounter: Payer: Self-pay | Admitting: Cardiology

## 2021-10-16 ENCOUNTER — Ambulatory Visit: Payer: Medicare Other | Attending: Cardiology | Admitting: Cardiology

## 2021-10-16 VITALS — BP 136/76 | HR 82 | Ht 62.0 in | Wt 191.0 lb

## 2021-10-16 DIAGNOSIS — I4821 Permanent atrial fibrillation: Secondary | ICD-10-CM

## 2021-10-16 DIAGNOSIS — I110 Hypertensive heart disease with heart failure: Secondary | ICD-10-CM

## 2021-10-16 DIAGNOSIS — I209 Angina pectoris, unspecified: Secondary | ICD-10-CM

## 2021-10-16 DIAGNOSIS — E785 Hyperlipidemia, unspecified: Secondary | ICD-10-CM

## 2021-10-16 NOTE — Patient Instructions (Signed)
Medication Instructions:  Your physician recommends that you continue on your current medications as directed. Please refer to the Current Medication list given to you today.  *If you need a refill on your cardiac medications before your next appointment, please call your pharmacy*   Lab Work: None If you have labs (blood work) drawn today and your tests are completely normal, you will receive your results only by: MyChart Message (if you have MyChart) OR A paper copy in the mail If you have any lab test that is abnormal or we need to change your treatment, we will call you to review the results.   Testing/Procedures: None   Follow-Up: At [facility], you and your health needs are our priority.  As part of our continuing mission to provide you with exceptional heart care, we have created designated Provider Care Teams.  These Care Teams include your primary Cardiologist (physician) and Advanced Practice Providers (APPs -  Physician Assistants and Nurse Practitioners) who all work together to provide you with the care you need, when you need it.  We recommend signing up for the patient portal called "MyChart".  Sign up information is provided on this After Visit Summary.  MyChart is used to connect with patients for Virtual Visits (Telemedicine).  Patients are able to view lab/test results, encounter notes, upcoming appointments, etc.  Non-urgent messages can be sent to your provider as well.   To learn more about what you can do with MyChart, go to [email or web address].    Your next appointment:   6 month(s)  The format for your next appointment:   In Person  Provider:   Brian Munley, MD    Other Instructions None  Important Information About Sugar       

## 2022-04-15 NOTE — Progress Notes (Unsigned)
Cardiology Office Note:    Date:  04/16/2022   ID:  Ardelle Park, DOB 16-Mar-1926, MRN [id number]  PCP:  Donnajean Lopes, MD  Cardiologist:  Shirlee More, MD    Referring MD: Karlene Einstein, MD    ASSESSMENT:    1. Hypertensive heart disease with heart failure ([place])   2. Permanent atrial fibrillation ([place])   3. Angina pectoris ([place])   4. Hyperlipidemia, unspecified hyperlipidemia type    PLAN:    In order of problems listed above:  [name] is doing well rate controlled atrial fibrillation and purposely not anticoagulated. Stable continue treatment including oral nitrates she is not having frequent angina Continue her statin await labs   Next appointment: 6 months   Medication Adjustments/Labs and Tests Ordered: Current medicines are reviewed at length with the patient today.  Concerns regarding medicines are outlined above.  No orders of the defined types were placed in this encounter.  No orders of the defined types were placed in this encounter.   Chief Complaint  Patient presents with   Follow-up    History of Present Illness:    [name] is a 89 y.o. female with a hx of hypertensive heart disease with heart failure permanent atrial fibrillation not anticoagulated with previous traumatic subdural hematoma angina pectoris with stable symptoms and hyperlipidemia last seen 10/16/2021 with stable heart failure.  Compliance with diet, lifestyle and medications: Yes  To go to transition to skilled nursing and has done well Despite adding salt to her diet she is not short of breath and she is not edematous Her son assures me she has lab work checked to collapse and will request a copy Has no cardiovascular symptoms of angina edema shortness of breath palpitation or syncope Past Medical History:  Diagnosis Date   A-fib ([place])    Anxiety    Cancer ([place]) 2019   right breast   Dementia ([place])    Dizziness 01/06/2018   Encounter for wellness examination  in adult 04/18/2015   Fibromyalgia    Hypertension    Hypothyroidism    Malignant neoplasm of lower-inner quadrant of right breast of female, estrogen receptor positive ([place]) 06/20/2017   Peripheral edema     Past Surgical History:  Procedure Laterality Date   ABDOMINAL HYSTERECTOMY     APPENDECTOMY     BLADDER SUSPENSION     BREAST LUMPECTOMY WITH RADIOACTIVE SEED LOCALIZATION Right 04/15/2017   Procedure: RIGHT BREAST LUMPECTOMY WITH RADIOACTIVE SEED LOCALIZATION;  Surgeon: Erroll Luna, MD;  Location: [facility];  Service: General;  Laterality: Right;  PECTORAL BLOCK; ERAS PATHWAY   CARPAL TUNNEL RELEASE     CHOLECYSTECTOMY     JOINT REPLACEMENT Left    TKR   KNEE ARTHROSCOPY Right     Current Medications: Current Meds  Medication Sig   acetaminophen (TYLENOL) 500 MG tablet Take 1,000 mg by mouth every 12 (twelve) hours as needed for mild pain.   albuterol (VENTOLIN HFA) 108 (90 Base) MCG/ACT inhaler Inhale 2 puffs into the lungs every 4 (four) hours as needed for wheezing or shortness of breath.   anastrozole (ARIMIDEX) 1 MG tablet Take 1 mg by mouth daily.   benzonatate (TESSALON) 100 MG capsule Take 100 mg by mouth daily as needed for cough.   Cholecalciferol (VITAMIN D3) 50 MCG (2000 UT) CAPS Take 2,000 Units by mouth daily.   diclofenac Sodium (VOLTAREN) 1 % GEL Apply topically 2 (two) times daily. Apply to both knees am  and pm daily   donepezil (ARICEPT) 5 MG tablet TAKE 1 TABLET EVERY NIGHT   esomeprazole (NEXIUM) 40 MG capsule Take 40 mg by mouth daily at 12 noon.   isosorbide mononitrate (IMDUR) 30 MG 24 hr tablet Take 1 tablet (30 mg total) by mouth daily.   levothyroxine (SYNTHROID, LEVOTHROID) 125 MCG tablet TAKE 1 TABLET DAILY AS DIRECTED   loratadine (CLARITIN) 10 MG tablet Take 10 mg by mouth daily.   losartan (COZAAR) 100 MG tablet Take 100 mg by mouth daily.   memantine (NAMENDA) 10 MG tablet 10 mg 2 (two) times daily.    nitroGLYCERIN  (NITROSTAT) 0.4 MG SL tablet PLACE 1 TABLET UNDER THE TONGUE EVERY 5 MINUTES AS NEEDED FOR CHEST PAIN   oxybutynin (DITROPAN) 5 MG tablet Take 5 mg by mouth at bedtime.   sertraline (ZOLOFT) 100 MG tablet TAKE  1 AND 1/2 TABLET (150 MG) DAILY AT BEDTIME   sertraline (ZOLOFT) 25 MG tablet Take 25 mg by mouth daily. Take with 100 mg tablet = 125 mg   simvastatin (ZOCOR) 20 MG tablet Take 1 tablet (20 mg total) by mouth daily.   torsemide (DEMADEX) 20 MG tablet Take 20 mg by mouth daily.   traMADol (ULTRAM) 50 MG tablet Take 50 mg by mouth daily as needed for pain.   traZODone (DESYREL) 100 MG tablet Take 100 mg by mouth at bedtime.     Allergies:   Patient has no known allergies.   Social History   Socioeconomic History   Marital status: Married    Spouse name: Not on file   Number of children: Not on file   Years of education: Not on file   Highest education level: Not on file  Occupational History   Not on file  Tobacco Use   Smoking status: Never   Smokeless tobacco: Never  Vaping Use   Vaping Use: Never used  Substance and Sexual Activity   Alcohol use: No   Drug use: No   Sexual activity: Not on file  Other Topics Concern   Not on file  Social History Narrative   Not on file   Social Determinants of Health   Financial Resource Strain: Not on file  Food Insecurity: Not on file  Transportation Needs: Not on file  Physical Activity: Not on file  Stress: Not on file  Social Connections: Not on file     Family History: The patient's family history includes Breast cancer in her sister; CAD in her brother; CVA in her father; Cancer in her brother; Diabetes in her brother, mother, and sister; Heart attack in her mother; Hypertension in her father, sister, and sister. ROS:   Please see the history of present illness.    All other systems reviewed and are negative.  EKGs/Labs/Other Studies Reviewed:    The following studies were reviewed today:  Cardiac Studies &  Procedures         MONITORS  LONG TERM MONITOR (3-14 DAYS) 12/29/2017  Narrative A ZIO monitor was utilized for 6 days 14 hours to assess paroxysmal atrial fibrillation.  The rhythm throughout his atrial fibrillation with minimum average and maximum heart rates of 31 68 and 86 bpm.  There were 5 pauses of 3 to 3.2 seconds noted.  The longest sustained bradycardia was 46 bpm.  Ventricular ectopy was rare with isolated PVCs couplets 3 triplets and 1 4 beat run of PVCs.  There were no triggered or symptomatic events   Conclusion persistent atrial fibrillation  heart rate control is good with rare ventricular ectopy             Recent Labs: No results found for requested labs within last 365 days.  Recent Lipid Panel    Component Value Date/Time   CHOL 162 01/18/2019 1523   TRIG 237 (H) 01/18/2019 1523   HDL 55 01/18/2019 1523   CHOLHDL 2.9 01/18/2019 1523   LDLCALC 69 01/18/2019 1523    Physical Exam:    VS:  BP 120/60 (BP Location: Left Arm, Patient Position: Sitting, Cuff Size: Normal)   Pulse 88   Ht '5\' 2"'$  (1.575 m)   SpO2 94%   BMI 34.93 kg/m     Wt Readings from Last 3 Encounters:  10/16/21 191 lb (86.6 kg)  10/30/20 190 lb (86.2 kg)  08/18/19 173 lb (78.5 kg)     GEN:  Well nourished, well developed in no acute distress HEENT: Normal NECK: No JVD; No carotid bruits LYMPHATICS: No lymphadenopathy CARDIAC: Irregular rate and rhythm  RESPIRATORY:  Clear to auscultation without rales, wheezing or rhonchi  ABDOMEN: Soft, non-tender, non-distended MUSCULOSKELETAL:  No edema; No deformity  SKIN: Warm and dry NEUROLOGIC:  Alert and oriented x 3 PSYCHIATRIC:  Normal affect    Signed, Shirlee More, MD  04/16/2022 2:24 PM    [place]

## 2022-04-16 ENCOUNTER — Ambulatory Visit: Payer: Medicare Other | Attending: Cardiology | Admitting: Cardiology

## 2022-04-16 ENCOUNTER — Encounter: Payer: Self-pay | Admitting: Cardiology

## 2022-04-16 VITALS — BP 120/60 | HR 88 | Ht 62.0 in

## 2022-04-16 DIAGNOSIS — I209 Angina pectoris, unspecified: Secondary | ICD-10-CM | POA: Diagnosis not present

## 2022-04-16 DIAGNOSIS — I4821 Permanent atrial fibrillation: Secondary | ICD-10-CM

## 2022-04-16 DIAGNOSIS — E785 Hyperlipidemia, unspecified: Secondary | ICD-10-CM | POA: Diagnosis not present

## 2022-04-16 DIAGNOSIS — I110 Hypertensive heart disease with heart failure: Secondary | ICD-10-CM

## 2022-04-16 NOTE — Patient Instructions (Signed)
Medication Instructions:  Your physician recommends that you continue on your current medications as directed. Please refer to the Current Medication list given to you today.  *If you need a refill on your cardiac medications before your next appointment, please call your pharmacy*   Lab Work: None If you have labs (blood work) drawn today and your tests are completely normal, you will receive your results only by: MyChart Message (if you have MyChart) OR A paper copy in the mail If you have any lab test that is abnormal or we need to change your treatment, we will call you to review the results.   Testing/Procedures: None   Follow-Up: At [facility], you and your health needs are our priority.  As part of our continuing mission to provide you with exceptional heart care, we have created designated Provider Care Teams.  These Care Teams include your primary Cardiologist (physician) and Advanced Practice Providers (APPs -  Physician Assistants and Nurse Practitioners) who all work together to provide you with the care you need, when you need it.  We recommend signing up for the patient portal called "MyChart".  Sign up information is provided on this After Visit Summary.  MyChart is used to connect with patients for Virtual Visits (Telemedicine).  Patients are able to view lab/test results, encounter notes, upcoming appointments, etc.  Non-urgent messages can be sent to your provider as well.   To learn more about what you can do with MyChart, go to [email or web address].    Your next appointment:   6 month(s)  Provider:   Brian Munley, MD    Other Instructions None  

## 2022-10-22 ENCOUNTER — Ambulatory Visit: Payer: Medicare Other | Admitting: Cardiology

## 2022-12-02 NOTE — Progress Notes (Unsigned)
Cardiology Office Note:    Date:  12/03/2022   ID:  [name], DOB Dec 17, 1926, MRN [id number]  PCP:  Garlan Fillers, MD  Cardiologist:  Norman Herrlich, MD    Referring MD: Garlan Fillers, MD    ASSESSMENT:    1. Hypertensive heart disease with heart failure (HCC)   2. Permanent atrial fibrillation (HCC)   3. Angina pectoris (HCC)   4. Hyperlipidemia, unspecified hyperlipidemia type    PLAN:    In order of problems listed above:  She has done well with heart failure no fluid overload ACE abdomen attic she will continue her current loop diuretic torsemide along with her antihypertensive losartan.   Stable rate control does not require rate suppressant medications and purposely is not anticoagulated with her previous traumatic intracranial hemorrhage Stable having no anginal discomfort She will continue her intermediate intensity statin Check labs today including renal function lipid profile and CBC   Next appointment: 6 months   Medication Adjustments/Labs and Tests Ordered: Current medicines are reviewed at length with the patient today.  Concerns regarding medicines are outlined above.  Orders Placed This Encounter  Procedures   EKG 12-Lead   No orders of the defined types were placed in this encounter.    History of Present Illness:    [name] is a 89 y.o. female with a hx of hypertensive heart disease with heart failure permanent atrial fibrillation not anticoagulated with previous traumatic subdural hematoma angina pectoris and hyperlipidemia last seen 04/16/2022.  Compliance with diet, lifestyle and medications: Yes  She has made the transition to skilled nursing and has done well without complication Has had no edema shortness of breath chest pain palpitation or syncope Past Medical History:  Diagnosis Date   A-fib (HCC)    Anxiety    Cancer (HCC) 2019   right breast   Dementia (HCC)    Dizziness 01/06/2018   Encounter for  wellness examination in adult 04/18/2015   Fibromyalgia    Hypertension    Hypothyroidism    Malignant neoplasm of lower-inner quadrant of right breast of female, estrogen receptor positive (HCC) 06/20/2017   Peripheral edema     Current Medications: Current Meds  Medication Sig   acetaminophen (TYLENOL) 500 MG tablet Take 1,000 mg by mouth every 12 (twelve) hours as needed for mild pain.   albuterol (VENTOLIN HFA) 108 (90 Base) MCG/ACT inhaler Inhale 2 puffs into the lungs every 4 (four) hours as needed for wheezing or shortness of breath.   anastrozole (ARIMIDEX) 1 MG tablet Take 1 mg by mouth daily.   benzonatate (TESSALON) 100 MG capsule Take 100 mg by mouth daily as needed for cough.   Cholecalciferol (VITAMIN D3) 50 MCG (2000 UT) CAPS Take 2,000 Units by mouth daily.   diclofenac Sodium (VOLTAREN) 1 % GEL Apply topically 2 (two) times daily. Apply to both knees am and pm daily   donepezil (ARICEPT) 5 MG tablet TAKE 1 TABLET EVERY NIGHT   esomeprazole (NEXIUM) 40 MG capsule Take 40 mg by mouth daily at 12 noon.   levothyroxine (SYNTHROID, LEVOTHROID) 125 MCG tablet TAKE 1 TABLET DAILY AS DIRECTED   loratadine (CLARITIN) 10 MG tablet Take 10 mg by mouth daily.   losartan (COZAAR) 100 MG tablet Take 100 mg by mouth daily.   memantine (NAMENDA) 10 MG tablet 10 mg 2 (two) times daily.    nitroGLYCERIN (NITROSTAT) 0.4 MG SL tablet PLACE 1 TABLET UNDER THE TONGUE EVERY 5 MINUTES AS NEEDED  FOR CHEST PAIN   oxybutynin (DITROPAN) 5 MG tablet Take 5 mg by mouth at bedtime.   sertraline (ZOLOFT) 100 MG tablet TAKE  1 AND 1/2 TABLET (150 MG) DAILY AT BEDTIME   sertraline (ZOLOFT) 25 MG tablet Take 25 mg by mouth daily. Take with 100 mg tablet = 125 mg   simvastatin (ZOCOR) 20 MG tablet Take 1 tablet (20 mg total) by mouth daily.   torsemide (DEMADEX) 20 MG tablet Take 20 mg by mouth daily.   traMADol (ULTRAM) 50 MG tablet Take 50 mg by mouth daily as needed for pain.   traZODone (DESYREL) 100 MG  tablet Take 100 mg by mouth at bedtime.      EKGs/Labs/Other Studies Reviewed:    The following studies were reviewed today:  Cardiac Studies & Procedures         MONITORS  LONG TERM MONITOR (3-14 DAYS) 12/09/2017  Narrative A ZIO monitor was utilized for 6 days 14 hours to assess paroxysmal atrial fibrillation.  The rhythm throughout his atrial fibrillation with minimum average and maximum heart rates of 31 68 and 86 bpm.  There were 5 pauses of 3 to 3.2 seconds noted.  The longest sustained bradycardia was 46 bpm.  Ventricular ectopy was rare with isolated PVCs couplets 3 triplets and 1 4 beat run of PVCs.  There were no triggered or symptomatic events   Conclusion persistent atrial fibrillation heart rate control is good with rare ventricular ectopy           EKG Interpretation Date/Time:  Tuesday December 03 2022 15:21:29 EDT Ventricular Rate:  93 PR Interval:    QRS Duration:  76 QT Interval:  314 QTC Calculation: 390 R Axis:   117  Text Interpretation: Atrial fibrillation Right axis deviation Septal infarct , age undetermined ST & T wave abnormality, consider inferior ischemia When compared with ECG of 08-Apr-2007 10:49, Atrial fibrillation has replaced Sinus rhythm Confirmed by Norman Herrlich ([id number]) on 12/03/2022 3:32:00 PM   Recent Labs: No results found for requested labs within last 365 days.  Recent Lipid Panel    Component Value Date/Time   CHOL 162 01/18/2019 1523   TRIG 237 (H) 01/18/2019 1523   HDL 55 01/18/2019 1523   CHOLHDL 2.9 01/18/2019 1523   LDLCALC 69 01/18/2019 1523    Physical Exam:    VS:  BP 110/74   Pulse 93   Ht 5\' 4"  (1.626 m)   Wt 188 lb (85.3 kg)   SpO2 93%   BMI 32.27 kg/m     Wt Readings from Last 3 Encounters:  12/03/22 188 lb (85.3 kg)  10/16/21 191 lb (86.6 kg)  10/30/20 190 lb (86.2 kg)     GEN: She looks frail examined in a wheelchair in no acute distress HEENT: Normal NECK: No JVD; No carotid  bruits LYMPHATICS: No lymphadenopathy CARDIAC: Irr Irr variable S1  RESPIRATORY:  Clear to auscultation without rales, wheezing or rhonchi  ABDOMEN: Soft, non-tender, non-distended MUSCULOSKELETAL:  No edema; No deformity  SKIN: Warm and dry NEUROLOGIC:  Alert and oriented x 3 PSYCHIATRIC:  Normal affect    Signed, Norman Herrlich, MD  12/03/2022 3:41 PM    [facility]

## 2022-12-03 ENCOUNTER — Encounter: Payer: Self-pay | Admitting: Cardiology

## 2022-12-03 ENCOUNTER — Ambulatory Visit: Payer: Medicare Other | Attending: Cardiology | Admitting: Cardiology

## 2022-12-03 VITALS — BP 110/74 | HR 93 | Ht 64.0 in | Wt 188.0 lb

## 2022-12-03 DIAGNOSIS — I209 Angina pectoris, unspecified: Secondary | ICD-10-CM

## 2022-12-03 DIAGNOSIS — E785 Hyperlipidemia, unspecified: Secondary | ICD-10-CM | POA: Diagnosis not present

## 2022-12-03 DIAGNOSIS — I4821 Permanent atrial fibrillation: Secondary | ICD-10-CM | POA: Diagnosis not present

## 2022-12-03 DIAGNOSIS — I110 Hypertensive heart disease with heart failure: Secondary | ICD-10-CM | POA: Diagnosis not present

## 2022-12-03 NOTE — Patient Instructions (Signed)
Medication Instructions:  Your physician recommends that you continue on your current medications as directed. Please refer to the Current Medication list given to you today.  *If you need a refill on your cardiac medications before your next appointment, please call your pharmacy*   Lab Work: Your physician recommends that you return for lab work in:   Labs today: BMP, Pro BNP, CBC, Lipids  If you have labs (blood work) drawn today and your tests are completely normal, you will receive your results only by: MyChart Message (if you have MyChart) OR A paper copy in the mail If you have any lab test that is abnormal or we need to change your treatment, we will call you to review the results.   Testing/Procedures: None   Follow-Up: At [facility], you and your health needs are our priority.  As part of our continuing mission to provide you with exceptional heart care, we have created designated Provider Care Teams.  These Care Teams include your primary Cardiologist (physician) and Advanced Practice Providers (APPs -  Physician Assistants and Nurse Practitioners) who all work together to provide you with the care you need, when you need it.  We recommend signing up for the patient portal called "MyChart".  Sign up information is provided on this After Visit Summary.  MyChart is used to connect with patients for Virtual Visits (Telemedicine).  Patients are able to view lab/test results, encounter notes, upcoming appointments, etc.  Non-urgent messages can be sent to your provider as well.   To learn more about what you can do with MyChart, go to [email or web address].    Your next appointment:   6 month(s)  Provider:   Norman Herrlich, MD    Other Instructions None

## 2022-12-03 NOTE — Addendum Note (Signed)
Addended by: Roxanne Mins I on: 12/03/2022 03:58 PM   Modules accepted: Orders

## 2022-12-04 LAB — BASIC METABOLIC PANEL
BUN/Creatinine Ratio: 20 (ref 12–28)
BUN: 23 mg/dL (ref 10–36)
CO2: 29 mmol/L (ref 20–29)
Calcium: 9.6 mg/dL (ref 8.7–10.3)
Chloride: 101 mmol/L (ref 96–106)
Creatinine, Ser: 1.16 mg/dL — ABNORMAL HIGH (ref 0.57–1.00)
Glucose: 136 mg/dL — ABNORMAL HIGH (ref 70–99)
Potassium: 4.4 mmol/L (ref 3.5–5.2)
Sodium: 145 mmol/L — ABNORMAL HIGH (ref 134–144)
eGFR: 44 mL/min/{1.73_m2} — ABNORMAL LOW (ref >59)

## 2022-12-04 LAB — LIPID PANEL
Chol/HDL Ratio: 3.1 ratio (ref 0.0–4.4)
Cholesterol, Total: 155 mg/dL (ref 100–199)
HDL: 50 mg/dL (ref >39)
LDL Chol Calc (NIH): 70 mg/dL (ref 0–99)
Triglycerides: 214 mg/dL — ABNORMAL HIGH (ref 0–149)
VLDL Cholesterol Cal: 35 mg/dL (ref 5–40)

## 2022-12-04 LAB — CBC
Hematocrit: 43.3 % (ref 34.0–46.6)
Hemoglobin: 14.1 g/dL (ref 11.1–15.9)
MCH: 30.9 pg (ref 26.6–33.0)
MCHC: 32.6 g/dL (ref 31.5–35.7)
MCV: 95 fL (ref 79–97)
Platelets: 310 10*3/uL (ref 150–450)
RBC: 4.57 x10E6/uL (ref 3.77–5.28)
RDW: 12.4 % (ref 11.7–15.4)
WBC: 8.5 10*3/uL (ref 3.4–10.8)

## 2022-12-04 LAB — PRO B NATRIURETIC PEPTIDE: NT-Pro BNP: 507 pg/mL (ref 0–738)

## 2023-06-24 ENCOUNTER — Ambulatory Visit: Admitting: Cardiology

## 2023-06-26 ENCOUNTER — Ambulatory Visit

## 2023-08-05 ENCOUNTER — Ambulatory Visit: Admitting: Cardiology

## 2023-09-16 ENCOUNTER — Ambulatory Visit: Admitting: Cardiology

## 2023-09-17 ENCOUNTER — Encounter: Payer: Self-pay | Admitting: Cardiology

## 2023-09-17 ENCOUNTER — Ambulatory Visit: Attending: Cardiology | Admitting: Cardiology

## 2023-09-17 VITALS — BP 130/60 | HR 81 | Ht 64.0 in | Wt 185.0 lb

## 2023-09-17 DIAGNOSIS — E782 Mixed hyperlipidemia: Secondary | ICD-10-CM | POA: Diagnosis not present

## 2023-09-17 DIAGNOSIS — I10 Essential (primary) hypertension: Secondary | ICD-10-CM | POA: Diagnosis not present

## 2023-09-17 DIAGNOSIS — I4821 Permanent atrial fibrillation: Secondary | ICD-10-CM | POA: Diagnosis not present

## 2023-09-17 DIAGNOSIS — R600 Localized edema: Secondary | ICD-10-CM

## 2023-09-17 DIAGNOSIS — D6859 Other primary thrombophilia: Secondary | ICD-10-CM | POA: Diagnosis not present

## 2023-09-17 DIAGNOSIS — M159 Polyosteoarthritis, unspecified: Secondary | ICD-10-CM | POA: Insufficient documentation

## 2023-09-17 DIAGNOSIS — M25569 Pain in unspecified knee: Secondary | ICD-10-CM | POA: Insufficient documentation

## 2023-09-17 DIAGNOSIS — M25561 Pain in right knee: Secondary | ICD-10-CM | POA: Insufficient documentation

## 2023-09-17 DIAGNOSIS — M609 Myositis, unspecified: Secondary | ICD-10-CM | POA: Insufficient documentation

## 2023-09-17 NOTE — Progress Notes (Signed)
 Cardiology Office Note   Date:  09/17/2023  ID:  Datra Clary, DOB 15-Mar-1926, MRN [id number] PCP: Yolande Toribio MATSU, MD  [facility] Providers Cardiologist:  Redell Leiter, MD Cardiology APP:  Carlin Delon BROCKS, NP     History of Present Illness [name] is a 89 y.o. female with a past medical history of permanent atrial fibrillation, dyslipidemia, history of traumatic SDH, dementia, history of breast cancer, hypothyroidism, hypertension.  12/29/2017 monitor A-fib burden 100%  She established care with Dr. Leiter in 2019 for the evaluation management of her paroxysmal atrial fibrillation.  A monitor was arranged which revealed her burden was 100% and the decision was made for rate control strategies, she has not been anticoagulated secondary to history of traumatic subdural hematoma and propensity for frequent falls.  Most recently she was evaluated by Dr. Leiter on 12/03/2022, she was overall stable from a cardiac perspective, advised to follow-up in 6 months.  She presents today accompanied by her son for follow-up of her atrial fibrillation.  She resides at [facility] in [place].  Her son provides most of her history, states a few months ago she began having issues with pedal edema, she was started on Demadex  20 mg daily by physician at facility and this is since subsided, repeat blood work is also been obtained.  She is mostly wheelchair-bound although she can transfer with assistance. She denies chest pain, palpitations, dyspnea, pnd, orthopnea, n, v, dizziness, syncope, edema, weight gain, or early satiety.    ROS: Review of Systems  Cardiovascular:  Positive for leg swelling.  Genitourinary:  Positive for urgency.  All other systems reviewed and are negative.    Studies Reviewed      Cardiac Studies & Procedures   ______________________________________________________________________________________________        SHERRILEE  LONG TERM MONITOR  (3-14 DAYS) 12/09/2017  Narrative A ZIO monitor was utilized for 6 days 14 hours to assess paroxysmal atrial fibrillation.  The rhythm throughout his atrial fibrillation with minimum average and maximum heart rates of 31 68 and 86 bpm.  There were 5 pauses of 3 to 3.2 seconds noted.  The longest sustained bradycardia was 46 bpm.  Ventricular ectopy was rare with isolated PVCs couplets 3 triplets and 1 4 beat run of PVCs.  There were no triggered or symptomatic events   Conclusion persistent atrial fibrillation heart rate control is good with rare ventricular ectopy       ______________________________________________________________________________________________      Risk Assessment/Calculati distal ons  CHA2DS2-VASc Score = 4   This indicates a 4.8% annual risk of stroke. The patient's score is based upon: CHF History: 0 HTN History: 1 Diabetes History: 0 Stroke History: 0 Vascular Disease History: 0 Age Score: 2 Gender Score: 1            Physical Exam VS:  BP 130/60   Pulse 81   Ht 5' 4 (1.626 m)   Wt 185 lb (83.9 kg) Comment: verbal  SpO2 93%   BMI 31.76 kg/m        Wt Readings from Last 3 Encounters:  09/17/23 185 lb (83.9 kg)  12/03/22 188 lb (85.3 kg)  10/16/21 191 lb (86.6 kg)    GEN: Well nourished, well developed in no acute distress NECK: No JVD; No carotid bruits CARDIAC: Irregularly irregular, no murmurs, rubs, gallops RESPIRATORY:  Clear to auscultation without rales, wheezing or rhonchi  ABDOMEN: Soft, non-tender, non-distended EXTREMITIES:  No edema; No deformity   ASSESSMENT  AND PLAN Permanent atrial fibrillation/hypercoagulable state-her rate is controlled, CHA2DS2-VASc score is 4, she is not anticoagulated secondary to previous traumatic subdural hematoma and shared decision making.  Hypertension-continue losartan 100 mg daily, Blood pressure is controlled 130/60.  Dyslipidemia-continue Zocor  20 mg daily, most recent LDL was 70 on  12/10/2022, followed by facility MD.   Pedal edema - on Demadex , no edema appreciated today. Discussed repeating echo, but not sure it would change her plan of care. She is asymptomatic and well-appearing.         Dispo: Follow up in 1 year.   Signed, Delon JAYSON Hoover, NP

## 2023-09-17 NOTE — Patient Instructions (Signed)
 Medication Instructions:  Your physician recommends that you continue on your current medications as directed. Please refer to the Current Medication list given to you today.  *If you need a refill on your cardiac medications before your next appointment, please call your pharmacy*  Lab Work: None If you have labs (blood work) drawn today and your tests are completely normal, you will receive your results only by: MyChart Message (if you have MyChart) OR A paper copy in the mail If you have any lab test that is abnormal or we need to change your treatment, we will call you to review the results.  Testing/Procedures: None  Follow-Up: At [facility], you and your health needs are our priority.  As part of our continuing mission to provide you with exceptional heart care, our providers are all part of one team.  This team includes your primary Cardiologist (physician) and Advanced Practice Providers or APPs (Physician Assistants and Nurse Practitioners) who all work together to provide you with the care you need, when you need it.  Your next appointment:   1 year(s)  Provider:   Delon Hoover, NP [place])    We recommend signing up for the patient portal called "MyChart".  Sign up information is provided on this After Visit Summary.  MyChart is used to connect with patients for Virtual Visits (Telemedicine).  Patients are able to view lab/test results, encounter notes, upcoming appointments, etc.  Non-urgent messages can be sent to your provider as well.   To learn more about what you can do with MyChart, go to [email or web address].   Other Instructions None
